# Patient Record
Sex: Female | Born: 1985 | Race: Black or African American | Hispanic: No | Marital: Married | Smoking: Never smoker
Health system: Southern US, Community
[De-identification: ages and names within clinical notes are randomized; demographics above are authoritative.]

## PROBLEM LIST (undated history)

## (undated) DIAGNOSIS — F319 Bipolar disorder, unspecified: Secondary | ICD-10-CM

## (undated) DIAGNOSIS — J45909 Unspecified asthma, uncomplicated: Secondary | ICD-10-CM

## (undated) HISTORY — DX: Bipolar disorder, unspecified: F31.9

## (undated) HISTORY — DX: Unspecified asthma, uncomplicated: J45.909

---

## 2021-07-22 ENCOUNTER — Emergency Department (HOSPITAL_COMMUNITY): Payer: Managed Care, Other (non HMO)

## 2021-07-22 ENCOUNTER — Other Ambulatory Visit: Payer: Self-pay

## 2021-07-22 ENCOUNTER — Encounter (HOSPITAL_COMMUNITY): Payer: Self-pay | Admitting: *Deleted

## 2021-07-22 ENCOUNTER — Emergency Department (HOSPITAL_COMMUNITY)
Admission: EM | Admit: 2021-07-22 | Discharge: 2021-07-22 | Disposition: A | Discharge status: Home / Self Care | Payer: Managed Care, Other (non HMO) | Attending: Emergency Medicine | Admitting: Emergency Medicine

## 2021-07-22 DIAGNOSIS — S90512A Abrasion, left ankle, initial encounter: Secondary | ICD-10-CM | POA: Insufficient documentation

## 2021-07-22 DIAGNOSIS — S0990XA Unspecified injury of head, initial encounter: Secondary | ICD-10-CM | POA: Insufficient documentation

## 2021-07-22 DIAGNOSIS — S80812A Abrasion, left lower leg, initial encounter: Secondary | ICD-10-CM | POA: Insufficient documentation

## 2021-07-22 DIAGNOSIS — S60811A Abrasion of right wrist, initial encounter: Secondary | ICD-10-CM | POA: Insufficient documentation

## 2021-07-22 DIAGNOSIS — R109 Unspecified abdominal pain: Secondary | ICD-10-CM | POA: Insufficient documentation

## 2021-07-22 DIAGNOSIS — M542 Cervicalgia: Secondary | ICD-10-CM | POA: Insufficient documentation

## 2021-07-22 DIAGNOSIS — R0789 Other chest pain: Secondary | ICD-10-CM | POA: Insufficient documentation

## 2021-07-22 DIAGNOSIS — S3992XA Unspecified injury of lower back, initial encounter: Secondary | ICD-10-CM | POA: Diagnosis present

## 2021-07-22 DIAGNOSIS — S300XXA Contusion of lower back and pelvis, initial encounter: Secondary | ICD-10-CM | POA: Diagnosis not present

## 2021-07-22 DIAGNOSIS — R000 Tachycardia, unspecified: Secondary | ICD-10-CM | POA: Diagnosis not present

## 2021-07-22 DIAGNOSIS — M549 Dorsalgia, unspecified: Secondary | ICD-10-CM

## 2021-07-22 LAB — CBC WITH DIFFERENTIAL/PLATELET
Abs Immature Granulocytes: 0.02 10*3/uL (ref 0.00–0.07)
Basophils Absolute: 0 10*3/uL (ref 0.0–0.1)
Basophils Relative: 0 %
Eosinophils Absolute: 0 10*3/uL (ref 0.0–0.5)
Eosinophils Relative: 0 %
HCT: 38.9 % (ref 36.0–46.0)
Hemoglobin: 12.1 g/dL (ref 12.0–15.0)
Immature Granulocytes: 0 %
Lymphocytes Relative: 27 %
Lymphs Abs: 2.1 10*3/uL (ref 0.7–4.0)
MCH: 28.7 pg (ref 26.0–34.0)
MCHC: 31.1 g/dL (ref 30.0–36.0)
MCV: 92.4 fL (ref 80.0–100.0)
Monocytes Absolute: 0.5 10*3/uL (ref 0.1–1.0)
Monocytes Relative: 6 %
Neutro Abs: 5.4 10*3/uL (ref 1.7–7.7)
Neutrophils Relative %: 67 %
Platelets: 304 10*3/uL (ref 150–400)
RBC: 4.21 MIL/uL (ref 3.87–5.11)
RDW: 14.2 % (ref 11.5–15.5)
WBC: 8 10*3/uL (ref 4.0–10.5)
nRBC: 0 % (ref 0.0–0.2)

## 2021-07-22 LAB — BASIC METABOLIC PANEL
Anion gap: 10 (ref 5–15)
BUN: 7 mg/dL (ref 6–20)
CO2: 24 mmol/L (ref 22–32)
Calcium: 9.4 mg/dL (ref 8.9–10.3)
Chloride: 107 mmol/L (ref 98–111)
Creatinine, Ser: 0.75 mg/dL (ref 0.44–1.00)
GFR, Estimated: >60 mL/min (ref >60)
Glucose, Bld: 92 mg/dL (ref 70–99)
Potassium: 3.7 mmol/L (ref 3.5–5.1)
Sodium: 141 mmol/L (ref 135–145)

## 2021-07-22 LAB — I-STAT BETA HCG BLOOD, ED (MC, WL, AP ONLY): I-stat hCG, quantitative: <5 m[IU]/mL (ref <5)

## 2021-07-22 MED ORDER — IOHEXOL 350 MG/ML SOLN
80.0000 mL | Freq: Once | INTRAVENOUS | Status: AC | PRN
  Administered 2021-07-22: 80 mL via INTRAVENOUS

## 2021-07-22 MED ORDER — OXYCODONE-ACETAMINOPHEN 5-325 MG PO TABS
1.0000 | ORAL_TABLET | Freq: Once | ORAL | Status: AC
  Administered 2021-07-22: 1 via ORAL
  Filled 2021-07-22: qty 1

## 2021-07-22 MED ORDER — MELOXICAM 7.5 MG PO TABS: 7.5000 mg | ORAL_TABLET | Freq: Every day | ORAL | 0 refills | Status: DC

## 2021-07-22 MED ORDER — ONDANSETRON 8 MG PO TBDP
8.0000 mg | ORAL_TABLET | Freq: Once | ORAL | Status: AC
  Administered 2021-07-22: 8 mg via ORAL
  Filled 2021-07-22: qty 1
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
## 2021-07-22 NOTE — ED Notes (Signed)
Patient transported to CT 
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
## 2021-11-03 ENCOUNTER — Ambulatory Visit (HOSPITAL_COMMUNITY)
Admission: EM | Admit: 2021-11-03 | Discharge: 2021-11-03 | Disposition: A | Discharge status: Home / Self Care | Payer: 59 | Attending: Psychiatry | Admitting: Psychiatry

## 2021-11-03 DIAGNOSIS — F3132 Bipolar disorder, current episode depressed, moderate: Secondary | ICD-10-CM | POA: Diagnosis not present

## 2021-11-03 MED ORDER — RISPERIDONE 1 MG PO TABS: 1.0000 mg | ORAL_TABLET | Freq: Two times a day (BID) | ORAL | 0 refills | Status: DC
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
## 2021-11-03 NOTE — Discharge Instructions (Addendum)
FOLLOW UP WITH OUT PATIENT PROVIDER  Patient is instructed prior to discharge to: Take all medications as prescribed by his/her mental healthcare provider. Report any adverse effects and or reactions from the medicines to his/her outpatient provider promptly. Patient has been instructed & cautioned: To not engage in alcohol and or illegal drug use while on prescription medicines. In the event of worsening symptoms, patient is instructed to call the crisis hotline, 911 and or go to the nearest ED for appropriate evaluation and treatment of symptoms. To follow-up with his/her primary care provider for your other medical issues, concerns and or health care needs.   Patient is instructed prior to discharge to: Take all medications as prescribed by his/her mental healthcare provider. Report any adverse effects and or reactions from the medicines to his/her outpatient provider promptly. Patient has been instructed & cautioned: To not engage in alcohol and or illegal drug use while on prescription medicines. In the event of worsening symptoms, patient is instructed to call the crisis hotline, 911 and or go to the nearest ED for appropriate evaluation and treatment of symptoms. To follow-up with his/her primary care provider for your other medical issues, concerns and or health care needs.

## 2021-11-03 NOTE — ED Provider Notes (Signed)
Behavioral Health Urgent Care Medical Screening Exam  Patient Name: Marie Rice MRN: [id number] Date of Evaluation: 11/03/21 Chief Complaint:   Diagnosis:  Final diagnoses:  Bipolar affective disorder, currently depressed, moderate (HCC)    History of Present illness: Marie Rice is a 35 y.o. female patient presented to [facility] as a walk in accompanied by her spouse with complaints of "I am fine, this is a misunderstanding me and my husband need to handle".  Marie Rice, 8 y.o., female patient seen face to face by this provider, consulted with Dr. Lucianne Muss; and chart reviewed on 11/03/21.  Per chart review patient was diagnosed with bipolar affective disorder with manic episode and psychotic features.  Past medications are Depakote, risperidone, and trazodone.  Patient reports that she has not taken medications in a long time, states "I do not need them, I know what I need".  Patient reports she has a history of inpatient psychiatric admissions.  Endorses 1 suicidal attempt in the past. States she is from [place]. She has a heavy accent. He recently moved from [facility] to [place] four months ago.   With patient's permission spouse is present during evaluation. During evaluation Marie Rice is in sitting position in no acute distress.  She makes good eye contact.  Speech is clear, coherent, normal rate and tone.  She is guarded with questions and withdrawn at times.She is alert/oriented x 4.  She is anxious with a flat affect. Denies any concerns with appetite and reports she has broken sleep at times. There is no indication that she is currently responding to internal/external stimuli or experiencing delusional thought content or paranoia. She denies racing thoughts. Denies auditory visual hallucinations. She denies suicidal/self-harm/homicidal ideation.  Patient contracts for safety. Denies access to firearms/weopons.  Spouse agrees, he  does not think patient is a harm to herself or others.  His major concern is her paranoia. Spouse reports patient is not being truthful and forthcoming with all the information.  Reports patient is only sleeping 2 hours per night and has decreased food intake with weight loss.  States she is paranoid and believes her food is being poisoned, patient interjects and states that is not true.  Spouse states if they want to have a private conversation they have to go outside because the patient believes the government and FBI is watching her because her father is a Occupational hygienist.  Spouse confirmed her father is a lower level Occupational hygienist.  Patient denies she is paranoid.  States she is eating healthier and exercising and has lost weight on purpose.  Spouse reports patients family and himself have taken the patient's phone away because she has sent hurtful messages to family members.  Patient interjects and states that is her way of healing.  Patient and spouse agree that patient has not sent any suicidal or homicidal comments.  Spouse states one of the reasons that the phone was taken away was due to the patient talking with someone "she should not talk to".  Patient interjects and states she was talking to a female friend and  "it could have been an affair, I did kiss him". This appeared to be a major stressor to spouse and patient. The collateral that spouse has provided about patients behavior appear to be manic with some psychotic features, but patient denies.   Patient is adamant that she would like to be discharged and return home.  Explained to patient and spouse she does not meet criteria for inpatient psychiatric admission.  Once patient realized that she would not be admitted she became more at ease.  She was willing to discuss medications.  She ask if Risperdal helps with the paranoia and auditory hallucinations but was adamant that she was not having any.  States she would be wiling to be restarted on Risperdal.   Patient agreed to have EKG performed.  Printed prescription for Risperdal 1 mg twice daily was given for 7-day supply.  Educated no refills will be given at this facility.  Outpatient psychiatric resources were provided.   At this time Marie Rice is educated and verbalizes understanding of mental health resources and other crisis services in the community. She is instructed to call 911 and present to the nearest emergency room should she experience any suicidal/homicidal ideation, auditory/visual/hallucinations, or detrimental worsening of her mental health condition.  She was a also advised by Clinical research associate that she could call the toll-free phone on insurance card to assist with identifying in network counselors and agencies.      Psychiatric Specialty Exam  Presentation  General Appearance:Casual  Eye Contact:Good  Speech:Clear and Coherent; Normal Rate  Speech Volume:Normal  Handedness:Right   Mood and Affect  Mood:Anxious  Affect:Congruent   Thought Process  Thought Processes:Coherent  Descriptions of Associations:Intact  Orientation:Full (Time, Place and Person)  Thought Content:Logical  Diagnosis of Schizophrenia or Schizoaffective disorder in past: No  Duration of Psychotic Symptoms: Less than six months  Hallucinations:Other (comment)  Ideas of Reference:None  Suicidal Thoughts:No  Homicidal Thoughts:No   Sensorium  Memory:Immediate Good; Recent Good; Remote Good  Judgment:Fair  Insight:Fair   Executive Functions  Concentration:Good  Attention Span:Good  Recall:Good  Fund of Knowledge:Good  Language:Good   Psychomotor Activity  Psychomotor Activity:Normal   Assets  Assets:Communication Skills; Housing; Intimacy; Leisure Time; Physical Health   Sleep  Sleep:Good  Number of hours: No data recorded  No data recorded  Physical Exam: Physical Exam Vitals and nursing note reviewed.  Constitutional:      General: She is not  in acute distress.    Appearance: Normal appearance. She is not ill-appearing.  HENT:     Head: Normocephalic.  Eyes:     General:        Right eye: No discharge.        Left eye: No discharge.     Conjunctiva/sclera: Conjunctivae normal.  Cardiovascular:     Rate and Rhythm: Normal rate.  Pulmonary:     Effort: Pulmonary effort is normal.  Musculoskeletal:        General: Normal range of motion.     Cervical back: Normal range of motion.  Skin:    Coloration: Skin is not jaundiced or pale.  Neurological:     Mental Status: She is alert and oriented to person, place, and time.  Psychiatric:        Attention and Perception: Attention and perception normal.        Mood and Affect: Mood is anxious.        Speech: Speech normal.        Behavior: Behavior is withdrawn.        Thought Content: Thought content normal.        Cognition and Memory: Cognition normal.        Judgment: Judgment normal.   Review of Systems  Constitutional: Negative.   HENT: Negative.    Eyes: Negative.   Respiratory: Negative.    Cardiovascular: Negative.   Musculoskeletal: Negative.   Skin: Negative.   Neurological: Negative.  Psychiatric/Behavioral:  The patient is nervous/anxious.   Blood pressure (!) 138/96, pulse (!) 120, temperature 98.6 F (37 C), temperature source Oral, resp. rate 20, SpO2 100 %. There is no height or weight on file to calculate BMI.  Musculoskeletal: Strength & Muscle Tone: within normal limits Gait & Station: normal Patient leans: N/A   [facility] MSE Discharge Disposition for Follow up and Recommendations: Based on my evaluation the patient does not appear to have an emergency medical condition and can be discharged with resources and follow up care in outpatient services for Medication Management  Discharge patient..  Patient agreed to have EKG performed. QT/QTC 334/437.  Printed prescription for Risperdal 1 mg twice daily was given for 7-day supply.  Educated no  refills will be given at this facility.   Outpatient psychiatric resources were provided for [facility], [facility], [facility], [facility], and [facility]iatric center. Instructed to make an appt ASAP for follow up.  No evidence of imminent risk to self or others at present.    Patient does not meet criteria for psychiatric inpatient admission. Discussed crisis plan, support from social network, calling 911, coming to the Emergency Department, and calling Suicide Hotline.    Ardis Hughs, NP 11/03/2021, 5:23 PM

## 2021-11-03 NOTE — BH Assessment (Signed)
Marie Rice,  Urgent, MRX #[id number]; 35 years old presents this date with her husband, Delmar Landau, [phone].  Pt denies SI or HI.  Pt reports hearing voices and paranoia for two weeks.  Pt admits to prior MH diagnosis ; also is not taking prescribed  medication for symptom management.  MSE signed by pt's husband.

## 2021-11-05 ENCOUNTER — Encounter: Payer: Self-pay | Admitting: Family Medicine

## 2021-11-05 ENCOUNTER — Other Ambulatory Visit (HOSPITAL_COMMUNITY)
Admission: RE | Admit: 2021-11-05 | Discharge: 2021-11-05 | Disposition: A | Discharge status: Home / Self Care | Payer: Managed Care, Other (non HMO) | Source: Ambulatory Visit | Attending: Family Medicine | Admitting: Family Medicine

## 2021-11-05 ENCOUNTER — Telehealth (HOSPITAL_COMMUNITY): Payer: Self-pay | Admitting: Family Medicine

## 2021-11-05 ENCOUNTER — Ambulatory Visit (INDEPENDENT_AMBULATORY_CARE_PROVIDER_SITE_OTHER): Payer: Managed Care, Other (non HMO) | Admitting: Family Medicine

## 2021-11-05 ENCOUNTER — Other Ambulatory Visit: Payer: Self-pay

## 2021-11-05 VITALS — BP 129/81 | HR 105 | Temp 98.4°F | Resp 16 | Ht 64.0 in | Wt 148.8 lb

## 2021-11-05 DIAGNOSIS — Z7689 Persons encountering health services in other specified circumstances: Secondary | ICD-10-CM

## 2021-11-05 DIAGNOSIS — F319 Bipolar disorder, unspecified: Secondary | ICD-10-CM | POA: Diagnosis not present

## 2021-11-05 DIAGNOSIS — Z202 Contact with and (suspected) exposure to infections with a predominantly sexual mode of transmission: Secondary | ICD-10-CM

## 2021-11-05 MED ORDER — RISPERIDONE 1 MG PO TABS: 1.0000 mg | ORAL_TABLET | Freq: Two times a day (BID) | ORAL | 0 refills | Status: DC

## 2021-11-05 NOTE — Progress Notes (Signed)
Patient is here for HFU, bipolar. Patient father is here for support.

## 2021-11-05 NOTE — BH Assessment (Signed)
Care Management - Follow Up Discharges   Writer attempted to make contact with patient today and was unsuccessful.  Writer left a HIPPA compliant voice message.   Per chart review, patient was provided with outpatient resources.   

## 2021-11-05 NOTE — Progress Notes (Signed)
New Patient Office Visit  Subjective:  Patient ID: Marie Rice, female    DOB: 1986-06-26  Age: 35 y.o. MRN: [id number]  CC:  Chief Complaint  Patient presents with   Establish Care    HPI Marie Rice presents for to establish care. Patient reports that she has recently been released form hospital 2/2 initial dx of bipolar disorder. Patient reports that she has had a past history of mental health issues with previous hospitalizations. She has been off and on meds but has now been on respiridal for 3 days and notes improvements. Some of her symptoms were worsened after having a baby 3 years ago. Patient also wants to be checked for STD that she believes she may have been exposed to.   Past Medical History:  Diagnosis Date   Asthma    Bipolar 1 disorder [facility])     Past Surgical History:  Procedure Laterality Date   CESAREAN SECTION      History reviewed. No pertinent family history.  Social History   Socioeconomic History   Marital status: Married    Spouse name: Not on file   Number of children: Not on file   Years of education: Not on file   Highest education level: Not on file  Occupational History   Not on file  Tobacco Use   Smoking status: Never   Smokeless tobacco: Never  Substance and Sexual Activity   Alcohol use: Not Currently   Drug use: Not Currently   Sexual activity: Not on file  Other Topics Concern   Not on file  Social History Narrative   Not on file   Social Determinants of Health   Financial Resource Strain: Not on file  Food Insecurity: Not on file  Transportation Needs: Not on file  Physical Activity: Not on file  Stress: Not on file  Social Connections: Not on file  Intimate Partner Violence: Not on file    ROS Review of Systems  Psychiatric/Behavioral:  Negative for self-injury, sleep disturbance and suicidal ideas. The patient is not nervous/anxious.   All other systems reviewed and are  negative.  Objective:   Today's Vitals: BP 129/81   Pulse (!) 105   Temp 98.4 F (36.9 C) (Oral)   Resp 16   Ht 5\' 4"  (1.626 m)   Wt 148 lb 12.8 oz (67.5 kg)   BMI 25.54 kg/m   Physical Exam Vitals and nursing note reviewed.  Constitutional:      General: She is not in acute distress. Cardiovascular:     Rate and Rhythm: Normal rate and regular rhythm.  Pulmonary:     Effort: Pulmonary effort is normal.     Breath sounds: Normal breath sounds.  Neurological:     General: No focal deficit present.     Mental Status: She is alert and oriented to person, place, and time.  Psychiatric:        Mood and Affect: Mood is anxious.        Speech: Speech normal.        Behavior: Behavior is agitated. Behavior is cooperative.    Assessment & Plan:   1. Bipolar I disorder (HCC) Continue present and referral to [facility] for counseling and to Psych for med management.  - Ambulatory referral to Psychiatry  2. Possible exposure to STD Cultures obtained and are pending. - Cervicovaginal ancillary only  3. Encounter to establish care    Outpatient Encounter Medications as of 11/05/2021  Medication Sig   risperiDONE (  RISPERDAL) 1 MG tablet Take 1 tablet (1 mg total) by mouth 2 (two) times daily.   No facility-administered encounter medications on file as of 11/05/2021.    Follow-up: No follow-ups on file.   Tommie Raymond, MD

## 2021-11-06 LAB — CERVICOVAGINAL ANCILLARY ONLY
Bacterial Vaginitis (gardnerella): NEGATIVE
Candida Glabrata: NEGATIVE
Candida Vaginitis: NEGATIVE
Chlamydia: NEGATIVE
Comment: NEGATIVE
Comment: NEGATIVE
Comment: NEGATIVE
Comment: NEGATIVE
Comment: NEGATIVE
Comment: NORMAL
Neisseria Gonorrhea: NEGATIVE
Trichomonas: NEGATIVE

## 2021-11-18 ENCOUNTER — Telehealth: Payer: Self-pay | Admitting: Clinical

## 2021-11-18 NOTE — Telephone Encounter (Signed)
I spoke with this pt per PCP request (see below). Pt seemed confused and stated that she had a psychiatry appt that her PCP set up. I explained my role to pt and scheduled an appt with her for 11/26/21 at 3:30pm.   "Could you please schedule an appt with this patient. Please let me know if you think she will need additional evaluation at a later point"

## 2021-11-25 ENCOUNTER — Telehealth: Payer: Self-pay | Admitting: Family Medicine

## 2021-11-25 NOTE — Telephone Encounter (Signed)
Pt requesting a call from a nurse regarding a vaginal swab result. Please advise and thank you

## 2021-11-25 NOTE — Telephone Encounter (Signed)
Patient is aware of lab results.

## 2021-11-26 ENCOUNTER — Institutional Professional Consult (permissible substitution): Payer: Self-pay | Admitting: Clinical

## 2022-01-20 ENCOUNTER — Encounter: Payer: Self-pay | Admitting: Family Medicine

## 2022-03-24 ENCOUNTER — Encounter: Payer: Self-pay | Admitting: Family Medicine
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
## 2022-08-29 ENCOUNTER — Encounter: Payer: Self-pay | Admitting: Family Medicine

## 2022-09-11 ENCOUNTER — Ambulatory Visit (HOSPITAL_COMMUNITY)
Admission: EM | Admit: 2022-09-11 | Discharge: 2022-09-11 | Disposition: A | Discharge status: Home / Self Care | Payer: BC Managed Care – PPO | Attending: Psychiatry | Admitting: Psychiatry

## 2022-09-11 DIAGNOSIS — F22 Delusional disorders: Secondary | ICD-10-CM

## 2022-09-11 DIAGNOSIS — G4700 Insomnia, unspecified: Secondary | ICD-10-CM | POA: Diagnosis not present

## 2022-09-11 MED ORDER — RISPERIDONE 1 MG PO TABS
1.0000 mg | ORAL_TABLET | Freq: Two times a day (BID) | ORAL | Status: DC
  Filled 2022-09-11 (×2): qty 14

## 2022-09-11 NOTE — Discharge Instructions (Addendum)
Please follow up with outpatient medication management with your established provider, Dr. Altamese [place] on Monday, September 15, 2022.  Patient is instructed prior to discharge to: Take all medications as prescribed by his/her mental healthcare provider. Report any adverse effects and or reactions from the medicines to his/her outpatient provider promptly. Keep all scheduled appointments, to ensure that you are getting refills on time and to avoid any interruption in your medication.  If you are unable to keep an appointment call to reschedule.  Be sure to follow-up with resources and follow-up appointments provided.  Patient has been instructed & cautioned: To not engage in alcohol and or illegal drug use while on prescription medicines. In the event of worsening symptoms, patient is instructed to call the crisis hotline, 911 and or go to the nearest ED for appropriate evaluation and treatment of symptoms. To follow-up with his/her primary care provider for your other medical issues, concerns and or health care needs.

## 2022-09-11 NOTE — ED Provider Notes (Signed)
Behavioral Health Urgent Care Medical Screening Exam  Patient Name: Marie Rice MRN: [id number] Date of Evaluation: 09/11/22 Chief Complaint:   Diagnosis:  Final diagnoses:  Insomnia, unspecified type  Paranoia (HCC)   History of Present illness: Marie Rice is a 36 y.o. female. Pt presents voluntarily to [facility] for walk-in assessment.  Pt is accompanied by her husband, Marie Rice, who remains with pt throughout the assessment as per pt verbal request and w/ pt verbal consent. Pt is assessed face-to-face by nurse practitioner.   Audyn Hutchinson-Walker, 36 y.o., female patient seen face to face by this provider, and chart reviewed on 09/11/22. Per prior documentation, pt has been diagnosed with bipolar 1 disorder, bipolar affective disorder with manic episode and psychotic features. Pt was previously seen at this facility on 11/03/21 presenting with poor sleep and paranoia. At that time she was discharged with 7 day sample for risperdal 1mg  twice daily. Today, pt history is provided by both pt and Christopher. Pt appears to present with possible thought blocking vs being guarded. She is slow to respond and responds in short responses. Eye contact minimal at times, staring at times.  Pt reports she is presenting today due to experiencing poor sleep for the past 3 to 4 nights and due to paranoia regarding people/something being in the attic. Pt states she has been sleeping 5 hours/night. However, states this is not correct. Marie Rice states pt is sleeping 2 to 3 hours maximum/night. He states he is aware of signs of pt decompensation and he believes pt is decompensating. He states pt is talking about "something in the attic". When asked about this, pt minimizes and states she is "hearing bumping and knocking sound" in the attic. When asked if she believes there is something up there, she is slow to respond, and states "not sure if  there's something". She denies SI/VI/HI, AVH. Marie Rice states he does not feel pt is a danger to herself or anyone else at this time but that he feels she needs to be treated in case it worsens. Pt has history of 2 inpatient psychiatric hospitalizations. Last hospitalization was about 4 years ago for "similar but worse" presentation. Pt denies NSSI or SA. She denies SU. She is living with her husband and daughter. She does not have access to a firearm or other weapon. She is being prescribed risperidone 0.5mg  QHS by Dr. Cristal Rice, although has not been taking it as prescribed, has started taking it 3 or 4 nights ago when she started experiencing symptoms and felt that she needed to address them. Per pt and Maggie Schwalbe, when pt was discharged from this facility in 2022 with prescription for risperdal 1mg  twice daily, this was effective for addressing symptoms. They state they attempted to reach pt's psychiatrist, Dr. 2023, today and were told they could not be seen today. They were told to come to this facility. Her next appointment with Dr. is in December 2023. I discussed providing 7 day sample of risperidone 1mg  twice daily and following up with Dr. Maggie Schwalbe. While I was consulting with pharmacy for samples, pt and January 2024 informed me that pt has an appointment with Dr. on Monday, October 2. Pt will be discharged with 7 day sample of risperidone 1mg  twice daily and will follow up with Dr. Cristal Rice on October 2.  Psychiatric Specialty Exam  Presentation  General Appearance:Appropriate for Environment; Casual; Fairly Groomed  Eye Contact:Other (comment) (minimal at times, staring at times)  Speech:Clear and Coherent; Other (comment) (  slow to respond)  Speech Volume:Normal  Handedness:Right  Mood and Affect  Mood:Euthymic  Affect:Flat  Thought Process  Thought Processes:Coherent  Descriptions of Associations:Intact  Orientation:Full (Time, Place and Person)  Thought Content:Delusions;  Paranoid Ideation  Diagnosis of Schizophrenia or Schizoaffective disorder in past: No  Duration of Psychotic Symptoms: Greater than six months  Hallucinations:None  Ideas of Reference:Paranoia  Suicidal Thoughts:No  Homicidal Thoughts:No  Sensorium  Memory:Immediate Good  Judgment:Intact  Insight:Present  Executive Functions  Concentration:Other (comment) (slow to respond)  Attention Span:Other (comment) (slow to respond)  Recall:Other (comment) (slow to respond)  Fund of Knowledge:Fair  Language:Fair  Psychomotor Activity  Psychomotor Activity:Normal  Assets  Assets:Communication Skills; Desire for Improvement; Financial Resources/Insurance; Housing; Social Support  Sleep  Sleep:Poor  Number of hours: No data recorded  No data recorded  Physical Exam: Physical Exam Cardiovascular:     Rate and Rhythm: Normal rate.  Pulmonary:     Effort: Pulmonary effort is normal.  Neurological:     Mental Status: She is alert and oriented to person, place, and time.  Psychiatric:        Mood and Affect: Mood normal. Affect is flat.        Speech: Speech is delayed.        Behavior: Behavior is cooperative.        Thought Content: Thought content is paranoid and delusional.    Review of Systems  Constitutional:  Negative for chills and fever.  Respiratory:  Negative for shortness of breath.   Cardiovascular:  Negative for chest pain and palpitations.  Gastrointestinal:  Negative for abdominal pain.  Neurological:  Negative for headaches.   Blood pressure (!) 146/90, pulse 97, temperature 98.1 F (36.7 C), temperature source Oral, resp. rate 18, SpO2 100 %. There is no height or weight on file to calculate BMI.  Musculoskeletal: Strength & Muscle Tone: within normal limits Gait & Station: normal Patient leans: N/A  [facility] MSE Discharge Disposition for Follow up and Recommendations: Based on my evaluation the patient does not appear to have an emergency medical  condition and can be discharged with resources and follow up care in outpatient services for Medication Management and Individual Therapy  Tharon Aquas, NP 09/11/2022, 5:37 PM

## 2022-09-11 NOTE — Progress Notes (Signed)
Patient presents to the [facility] stating that she has been experienicng anxiety and sleep disturbance.  Patient states that she sees Dr. Altamese [place] and is prescribed Buspar, but she states that it is not working.  Patient states that she has only slept a couple hours each night over the past few days and she is starting to have thought disturbance.  She is hearing noises in the celiling.  Patient denies SI/HI. Patient has no substance use issues.  Patient's husband is with her and states that they were her last year for the same issue.  Patient is routine.

## 2023-03-12 ENCOUNTER — Ambulatory Visit (HOSPITAL_COMMUNITY)
Admission: EM | Admit: 2023-03-12 | Discharge: 2023-03-12 | Disposition: A | Discharge status: Home / Self Care | Payer: BC Managed Care – PPO | Attending: Family | Admitting: Family

## 2023-03-12 DIAGNOSIS — F319 Bipolar disorder, unspecified: Secondary | ICD-10-CM | POA: Diagnosis not present

## 2023-03-12 MED ORDER — RISPERIDONE 1 MG PO TABS: 1.0000 mg | ORAL_TABLET | Freq: Two times a day (BID) | ORAL | 0 refills | Status: DC

## 2023-03-12 NOTE — Progress Notes (Signed)
   03/12/23 1256  [place] (Walk-ins at [facility], [facility], [facility] only)  How Did You Hear About [place]? Family/Friend (father brought her in)  What Is the Reason for Your Visit/Call Today? Pt is a 37 yo female who presented voluntarily and accompanied by her father, Daiva Huge, due to worsening symptoms of Bipolar d/o. Pt gave verbal permission for father to participate in her assessment. Pt stated that she was prescribed and is taking at times her medications. Pt stated that she was seeing a psychiatrist and taking medications but the medication "made me sick." Per father, pt was on a lower does of the same medications she had been taking and "was doing fine" but then her provider rasied the dosage. Pt stated she was having unwanted side effects and stopped taking them more than 6 months ago. Per pt she has not been taking her medications or taking them very inconsistently for about a year. Hx of Bipolar d/o. Pt denied SI, HI, NSSH, AVH, pranoia and any substance use. Pt did report a suicide attempt about 7 years ago which she stated was more of a gesture than a true attempt. She started to jump from a high cliff but stopped herself. Pt stated that she has seen an OP therapist but has visits by phone and only calls her as needed.  How Long Has This Been Causing You Problems? > than 6 months  Have You Recently Had Any Thoughts About Hurting Yourself? No  Are You Planning to Commit Suicide/Harm Yourself At This time? No  Have you Recently Had Thoughts About [place]? No  Are You Planning To Harm Someone At This Time? No  Are you currently experiencing any auditory, visual or other hallucinations? No  Have You Used Any Alcohol or Drugs in the Past 24 Hours? No  Do you have any current medical co-morbidities that require immediate attention? No  Clinician description of patient physical appearance/behavior: casually dressed, seemed neat and clean, calm but irritable, cooperative but a bit demonding,  alert and seemed fully oriented.  What Do You Feel Would Help You the Most Today? Medication(s)  If access to [facility] was not available, would you have sought care in the Emergency Department? No  Determination of Need Routine (7 days)  Options For Referral Medication Management;Outpatient Therapy

## 2023-03-12 NOTE — ED Provider Notes (Signed)
Behavioral Health Urgent Care Medical Screening Exam  Patient Name: Marie Rice MRN: [id number] Date of Evaluation: 03/12/23 Chief Complaint:   Diagnosis:  Final diagnoses:  Bipolar I disorder ([place])    History of Present illness: Marie Rice is a 37 y.o. female. Patient presents voluntarily to [facility] behavioral health for walk-in assessment.  Patient is accompanied by her father, Christy Sartorius.  Patient prefers that Christy Sartorius remain present during assessment.  Patient is assessed, face-to-face, by nurse practitioner. She is seated in assessment area, no acute distress. Consulted with provider, Dr.  Dwyane Dee, and chart reviewed on 03/12/2023. She  is alert and oriented, pleasant and cooperative during assessment.   Marie Rice would like to have her risperidone medication refilled.  She is also seeking additional outpatient psychiatry resources.  Patient reports "I stop see my psychiatrist because I asked for a work excuse and he could not provide that."  She feels that she would be more satisfied with a different outpatient provider, most recently seen by Dr. Altamese Carthage at [facility].  She has been intermittently compliant with medications including risperidone 1 mg twice daily.  She has run out of her medication completely within the last month.  Patient is linked with virtual individual counseling provider.  Typically meets with this provider by phone. Patient's diagnoses include bipolar 1 disorder, bipolar affective disorder manic episode.  She denies history of inpatient psychiatric hospitalization.  No family mental health history reported.  Patient  presents with euthymic mood, congruent affect. She  denies suicidal and homicidal ideations. Denies history of nonsuicidal self-harm behavior.  Patient easily  contracts verbally for safety with this Probation officer.  Lynann endorses 1 previous episode of suicidal ideation.  Approximately 7 years ago she had suicidal ideation with thoughts  to jump from a high cliff but did follow through with plan.    Patient has normal speech and behavior.  She  denies auditory and visual hallucinations.  Patient is able to converse coherently with goal-directed thoughts and no distractibility or preoccupation.  Denies symptoms of paranoia.  Objectively there is no evidence of psychosis/mania or delusional thinking.  Marie Rice resides in [place] with her husband.  She denies access to weapons.  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  Patient endorses average sleep and appetite.  She denies alcohol and substance use.  Patient offered support and encouragement.  She gives verbal consent to speak with her father Christy Sartorius.  Patient's father reports he believes patient is not sleeping the 7 to 8 hours she reported each night.  He reports that patient is often working late.  Per patient's father, "every time she stops taking her medication she gets worse." Patient's father would prefer patient consider meeting with counselor in a face-to-face format.  Patient agrees with plan. Patient's father denies safety concerns and confirms no knowledge of access to any weapons.  He agrees with plan for follow-up with outpatient psychiatry resources.   Patient and family are educated and verbalize understanding of mental health resources and other crisis services in the community. They are instructed to call 911 and present to the nearest emergency room should patient experience any suicidal/homicidal ideation, auditory/visual/hallucinations, or detrimental worsening of mental health condition.      [facility] from 03/12/2023 in [facility] ED from 11/03/2021 in [facility] ED from 07/22/2021 in [facility] Emergency Department at [place] No Risk No Risk No Risk  Psychiatric Specialty Exam  Presentation  General Appearance:Appropriate for  Environment; Casual  Eye Contact:Good  Speech:Clear and Coherent; Normal Rate  Speech Volume:Normal  Handedness:Right   Mood and Affect  Mood: Euthymic  Affect: Congruent   Thought Process  Thought Processes: Coherent; Goal Directed  Descriptions of Associations:Intact  Orientation:Full (Time, Place and Person)  Thought Content:Logical; WDL  Diagnosis of Schizophrenia or Schizoaffective disorder in past: No  Duration of Psychotic Symptoms: Greater than six months  Hallucinations:None  Ideas of Reference:None  Suicidal Thoughts:No  Homicidal Thoughts:No   Sensorium  Memory: Immediate Good; Recent Fair  Judgment: Intact  Insight: Present   Executive Functions  Concentration: Fair  Attention Span: Fair  Recall: [place] of Knowledge: Good  Language: Good   Psychomotor Activity  Psychomotor Activity: Normal   Assets  Assets: Communication Skills; Desire for Improvement; Financial Resources/Insurance; Housing; Physical Health; Resilience; Social Support   Sleep  Sleep: Poor  Number of hours: No data recorded  Physical Exam: Physical Exam Vitals and nursing note reviewed.  Constitutional:      Appearance: Normal appearance. She is well-developed and normal weight.  HENT:     Head: Normocephalic and atraumatic.     Nose: Nose normal.  Cardiovascular:     Rate and Rhythm: Normal rate.  Pulmonary:     Effort: Pulmonary effort is normal.  Musculoskeletal:        General: Normal range of motion.     Cervical back: Normal range of motion.  Skin:    General: Skin is warm and dry.  Neurological:     Mental Status: She is alert and oriented to person, place, and time.  Psychiatric:        Attention and Perception: Attention and perception normal.        Mood and Affect: Mood and affect normal.        Speech: Speech normal.        Behavior: Behavior normal. Behavior is cooperative.        Thought Content: Thought content  normal. Thought content is not paranoid.        Cognition and Memory: Cognition and memory normal.    Review of Systems  Constitutional: Negative.   HENT: Negative.    Eyes: Negative.   Respiratory: Negative.    Cardiovascular: Negative.   Gastrointestinal: Negative.   Genitourinary: Negative.   Musculoskeletal: Negative.   Skin: Negative.   Neurological: Negative.   Psychiatric/Behavioral: Negative.     Blood pressure 102/75, pulse 93, temperature (!) 97.5 F (36.4 C), temperature source Oral, resp. rate 18, SpO2 100 %. There is no height or weight on file to calculate BMI.  Musculoskeletal: Strength & Muscle Tone: within normal limits Gait & Station: normal Patient leans: N/A   [place] M[facility] Discharge Disposition for Follow up and Recommendations: Based on my evaluation the patient does not appear to have an emergency medical condition and can be discharged with resources and follow up care in outpatient services for Medication Management and Individual Therapy Follow-up with outpatient psychiatry, resources provided. Medications: -Risperidone 1 mg twice daily/mood   Lucky Rathke, FNP 03/12/2023, 1:47 PM

## 2023-03-12 NOTE — Discharge Instructions (Addendum)
Based on the information that you have provided and the presenting issues outpatient services and resources for have been recommended.  It is imperative that you follow through with treatment recommendations within 5-7 days from the of discharge to mitigate further risk to your safety and mental well-being. A list of referrals has been provided below to get you started.  You are not limited to the list provided.  In case of an urgent crisis, you may contact the Mobile Crisis Unit with [facility] at 1.8034804044.   Therapists that accept [facility] and [facility]  [address] [place], [facility], [facility]    Our Practice  Psychotherapy, which is also often referred to as talk therapy, is a mental health treatment that addresses the difficulties one encounters as the result of mental illness, emotional trauma, adjustment difficulties, and a wide variety of mental health challenges. Psychotherapy is a treatment that is intended to assist a person in controlling and eliminating negative symptoms that have a problematic impact on one's daily functioning and thought processes. The desired outcome is improved functioning, increased well-being, and emotional healing.    Our Treatment Focus Our focus is to help individuals heal, energize, and become aware of their inner strengths. We achieve this by providing a neutral safe space, listening to your concerns, and customizing a treatment plan.   Our Patient Promise We promise to be there for you every step of your journey. Our goal is to help you grow from your struggles, heal from your pain, and move forward to where you want to be in your life.             Based on the information you have provided and the presenting issue, outpatient services and resources for have been recommended.  It is imperative that you follow through with  treatment recommendations within 5-7 days from the of discharge to mitigate further risk to your safety and mental well-being. A list of referrals has been provided below to get you started.  You are not limited to the list provided.  In case of an urgent crisis, you may contact the Mobile Crisis Unit with [facility] at [phone].   Purvis Kilts, MSW, [place], [place] [address] [place], [place] [address] Office [phone] Fax [phone]   I am pleased that you have selected me as your therapist.  I have a Restaurant manager, fast food of Social Work degree, obtained at the [employer] in 2000.  I am licensed with the [place] Work Geographical information systems officer ([id number]).   The services offered to you include individual, family, and group counseling.  My therapeutic approach  is derived from my training and experience in Cognitive-Behavioral Therapy, Solution Focused Therapy, Reality Therapy, as well as, interpersonal and developmental theories of counseling.  I have special interests in anxiety, depression, sexual and physical abuse, play therapy, child and adolescent development, and family systems.  I am specifically trained in Trauma Focused-Cognitive Behavioral Therapy (TF-CBT).  I will not discriminate because of age, race, gender, sexual orientation or religion.  If there is anything I should know concerning your culture or religion, I ask that you please inform me so that I can better understand you.  If for any reason, I determine that my knowledge and expertise are not sufficient for your particular needs, I will make every effort to refer you to another counselor who  is prepared to work more effectively with you.  Are you or a loved one dealing with the aftermath of a traumatic experience? Do life transitions leave you grappling with feelings of depression or anxiety? Are you concerned about your child's negative behaviors, sleep  disturbances, or difficulties during a divorce? It's important to understand that children primarily operate based on emotions, while adults tend to use rational thinking. This emotional focus can impact a child's ability to make effective decisions and express their feelings.   Services Individual Counseling  Individual counseling is a personal opportunity to receive support and experience growth during challenging times in life. Individual counseling can help one deal with many personal topics in life such as anger, depression, anxiety, substance abuse, marriage and relationship challenges, parenting problems, school difficulties, career changes, etc.  Family Counseling  Families often are faced with issues and challenges that greatly impact all the members. The goal of family therapy is to renew and maintain the natural family structure and support group with an emphasis on returning the individual to a healthy family and community life. Family counseling can help improve communication, resolve conflict, and improve connections. [/one-half-first]    Parenting Coordinator Parent Coordinator is a neutral third party acting in the children's best interest attempting to reach a fair compromise of the issues at hand. Assisting parents manage their parenting plan, improve communication, and resolve disputes. A court appointment neutral party.     Treatment specialization includes: Depression  Anxiety  Attention Deficit/Hyperactive Disorder  Grief Counseling  Conflict Resolution  Emotional Regulation  Family Counseling  Divorce/Separation  Mediation  Conflict Resolution  Relationship Building  Parenting Coordinator  Parental Support    Base on the information you have provided and the presenting issue, outpatient services and resources for have been recommended.  It is imperative that you follow through with treatment recommendations within 5-7 days from the of discharge to  mitigate further risk to your safety and mental well-being. A list of referrals has been provided below to get you started.  You are not limited to the list provided.  In case of an urgent crisis, you may contact the Mobile Crisis Unit with [facility] at 1.404-143-0101.    [facility], [place] [address], [place] [address]  [phone]  Are you feeling "some kind of way", unsettled in yourself, or having a difficult time identifying how to navigate through your world. You are not alone. We all encounter times when the life we are living becomes overwhelming and we wish for someone to hear the things we are feeling inside but are unable to express. We are looking to understand why these things are happening to [place], and how to feel at peace with ourselves. We want to know that these negative feelings will not always be with [place]. No, I do not have a magic wand that will solve all your worries.What I offer is the opportunity to listen to your story and help you develop  As a graduate of [employer], with a Masters of arts in counseling psychology, I serve a diverse population while personalizing care. The skills used come from art therapy, play therapy, CBT(Cognitive Behavioral Therapy), EMDR, and other specialized therapeutic interventions. I cater to my client's needs while using best practices. I started my counseling practice with the desire of "helping others heal" from the conflict they have faced in life and their personal struggles. I have had the honor of assisting  people overcome many of their life struggles and to move forward and enjoy the life that they wish to have. Give me a call and let me know how I can help you.                                                                                               Medication Management providers that accept [employer] and [employer] [facility], [place], [place], [address] [phone] phone [phone] phone ([address], AmeriHealth, Anthem/Elevance, BCBS, [place], [place], [employer] of America, Healthy [place], [place], [place], [place], [place], [employer], [place], Out of Network)  [employer], [address] [place], [place], [address] [phone] phone ([place], Anthem/Elevance, [employer] Options/Carelon, [place], [place], [place], [place], [place], Medicare, [place], [place], [place], [facility])   [facility] [address], [place], [place], [address] [phone] phone [phone] fax ([address], [place] , [place], [place], [place], UHC, [place], Self-Pay)  Evangeline Dakin [place]. [place], [place], [address] [phone] phone ([address] Anthem/Elevance, [place], [place], [place], [place], [place], [employer], [place], [place], [place], [facility])  [place] - 6-8 MONTH WAIT FOR THERAPY; SOONER FOR MEDICATION MANAGEMENT [address] [place], [place], [address] [phone] phone ([address] [place], [place], [place], [place], Friday Health Plans, [place], [place], [place], [place], [place], [place], Tricare, [facility], The [employer] Companies, Company secretary)  [place] [place], [place] [address]  [phone]  [place] [address] [place],  [address]  [phone]   [facility] [address], [place], [place], [address] [phone] phone  [facility] [address], [place], [place], [address] [phone] phone  [facility] [address], [place], [place],  [address] [phone] phone   Patient is instructed prior to discharge to:  Take all medications as prescribed by his/her mental healthcare provider. Report any adverse effects and or reactions from the medicines to his/her outpatient provider promptly. Keep all scheduled appointments, to ensure that you are getting refills on time and to avoid any interruption in your medication.  If you are unable to keep an appointment call to reschedule.  Be sure to follow-up with resources and follow-up appointments provided.  Patient has been instructed & cautioned: To not engage in alcohol and or illegal drug use while on prescription medicines. In the event of worsening symptoms, patient is instructed to call the crisis hotline, 911 and or go to the nearest ED for appropriate evaluation and treatment of symptoms. To follow-up with his/her primary care provider for your other medical issues, concerns and or health care needs.  Information: -National Suicide Prevention Lifeline 1-800-SUICIDE or 228-386-2198.  -988 offers 24/7 access to trained crisis counselors who can help people experiencing mental health-related distress. People can call or text 988 or chat 988lifeline.org for themselves or if  they are worried about a loved one who may need crisis support.

## 2023-04-09 ENCOUNTER — Telehealth (HOSPITAL_COMMUNITY): Payer: Self-pay | Admitting: *Deleted

## 2023-04-09 NOTE — Telephone Encounter (Signed)
Risperidone request for patient, she has never been seen here on second floor only downstairs thru emergency services. Refer this request to Doran Heater NP as she last saw her and she should be out of her med but no follow seems to have been kept.

## 2023-07-18 ENCOUNTER — Ambulatory Visit (HOSPITAL_COMMUNITY)
Admission: EM | Admit: 2023-07-18 | Discharge: 2023-07-18 | Disposition: A | Discharge status: Home / Self Care | Payer: BC Managed Care – PPO | Attending: Psychiatry | Admitting: Psychiatry

## 2023-07-18 DIAGNOSIS — F3130 Bipolar disorder, current episode depressed, mild or moderate severity, unspecified: Secondary | ICD-10-CM

## 2023-07-18 MED ORDER — SERTRALINE HCL 50 MG PO TABS: 50.0000 mg | ORAL_TABLET | Freq: Every day | ORAL | 0 refills | Status: DC

## 2023-07-18 NOTE — Discharge Instructions (Addendum)
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
## 2023-07-18 NOTE — Progress Notes (Signed)
   07/18/23 1515  [facility] Triage Screening (Walk-ins at [facility] only)  How Did You Hear About [place]? Family/Friend  What Is the Reason for Your Visit/Call Today? Pt. arrived to [facility] accompanied by her father. Pts states medication Hinda Glatter) is not working for her at this time. Pt. states she has used it for 3 months.  She has depression and anxiety, also lacks motivation. She states she has trouble with taking care of herself, daughter and household chores. She says her legs feels weak and she agitated.  How Long Has This Been Causing You Problems? 1-6 months  Have You Recently Had Any Thoughts About Hurting Yourself? No  Are You Planning to Commit Suicide/Harm Yourself At This time? No  Have you Recently Had Thoughts About Hurting Someone [name]? No  Are You Planning To Harm Someone At This Time? No  Are you currently experiencing any auditory, visual or other hallucinations? No  Have You Used Any Alcohol or Drugs in the Past 24 Hours? No  Do you have any current medical co-morbidities that require immediate attention? No  Clinician description of patient physical appearance/behavior: casually dressed, calm, soft spoken and cooperative  What Do You Feel Would Help You the Most Today? Medication(s)  If access to [facility] was not available, would you have sought care in the Emergency Department? Yes  Determination of Need Routine (7 days)  Options For Referral Medication Management

## 2023-08-17 ENCOUNTER — Ambulatory Visit (HOSPITAL_COMMUNITY)
Admission: EM | Admit: 2023-08-17 | Discharge: 2023-08-17 | Disposition: A | Discharge status: Home / Self Care | Payer: BC Managed Care – PPO | Attending: Psychiatry | Admitting: Psychiatry

## 2023-08-17 DIAGNOSIS — F319 Bipolar disorder, unspecified: Secondary | ICD-10-CM

## 2023-08-17 NOTE — Progress Notes (Signed)
   08/17/23 1428  [facility] Triage Screening (Walk-ins at [facility] only)  How Did You Hear About [place]? Family/Friend  What Is the Reason for Your Visit/Call Today? Pt presents to [facility] voluntarily accompanied by her husband and daughter seeking medication management.Pt reports she was prescribed Zoloft 1 month ago at [facility] and believes the Zoloft is not working properly. Pt receives outpatient therapy at [facility], her last appointment was 2 weeks ago. She also prescribed an Invega injection every 3 months and her last injection was 1 month ago.  She reports lack of motivation, difficulty completing tasks, difficulty retaining information, increased sleep, decreased appetite.Pt denies SI/HI and AVH.  How Long Has This Been Causing You Problems? 1 wk - 1 month  Have You Recently Had Any Thoughts About Hurting Yourself? No  Are You Planning to Commit Suicide/Harm Yourself At This time? No  Have you Recently Had Thoughts About Hurting Someone [name]? No  Are You Planning To Harm Someone At This Time? No  Are you currently experiencing any auditory, visual or other hallucinations? No  Have You Used Any Alcohol or Drugs in the Past 24 Hours? No  Do you have any current medical co-morbidities that require immediate attention? No  Clinician description of patient physical appearance/behavior: nervous, cooperative, soft spoken, fairly groomed  What Do You Feel Would Help You the Most Today? Medication(s)  If access to [facility] was not available, would you have sought care in the Emergency Department? No  Determination of Need Routine (7 days)  Options For Referral Medication Management

## 2023-08-17 NOTE — ED Notes (Signed)
Patient  sleeping in no acute stress. RR even and unlabored .Environment secured .Will continue to monitor for safely. 

## 2023-08-17 NOTE — ED Provider Notes (Cosign Needed Addendum)
Behavioral Health Urgent Care Medical Screening Exam  Patient Name: Marie Rice MRN: [id number] Date of Evaluation: 08/17/23 Chief Complaint:  "I am lacking motivation and energy" Diagnosis:  Final diagnoses:  Bipolar depression (HCC)   History of Present illness:  Marie Rice is a 37 y.o. female, with a history of bipolar disorder, presents today voluntarily, or requesting medication management.  Marie Rice, 37 y.o., female patient seen face-to- face by this provider, consulted with Dr. Lucianne Muss; and chart reviewed on 08/17/23.   Marie Rice is followed by [facility] for psychiatry Briant Sites, and she receives counseling services through the same agency.   She reports since March experiencing worsening depression, lack of motivation and physical energy and unable to complete tasks. She reports typically struggling with mania type  symptoms and she misses feeling energetic and goal directed.  Patient reports never having a bout of bipolar depression as she has had since March of this year.  She reports no motivation to get up and out of work although she continues to work, she reports there is loads of laundry piled up as she has no energy to complete daily chores or task, and reports sleeping on average 9 to 10 hours per night.  She feels that the depression has worsened since she was placed on Invega during her last psychiatric hospitalization which was approximately 3 months ago while in [place].  Patient reports prior to being psychiatrically admitted 3 months ago she became very manic as she was working on different projects for work and recalls missing a few days of sleep which subsequently put her over the edge and caused her to experience mania.  She reports prior to the hospitalization she has been prescribed risperidone on and off since 2017 which was her first mental health break.  She reports during a subsequent mental health break  in 2020 is when she was diagnosed with bipolar 1 disorder.  She endorses 1 attempted suicide in which she had threatened to jump off her balcony several years back and was able to be coached down and to come back in the house by her husband.  She was psychiatrically admitted after that suicide attempt, but endorses the depression was only there for approximately a day and then she transition to mania.  She reports most of the time she can manage her manic episodes although endorses during a few of her manic episodes developing paranoia and hallucinations after going without sleep for 2 days.  She endorses that when she is able to force herself to sleep during her manic episode she feels that the mania symptoms are beneficial that she is able to get several things accomplished.  She reports being placed on Invega as during several of her manic episode she has thrown her psychiatric medications away and the psychiatrist treating her in [place] felt it would be more appropriate for her to start a long-acting injection.  She reports when taking risperidone she did not have any symptoms of anhedonia or increased sleep.  She also endorses bilateral leg tremors which have been present for several months although now she feels she is unable to fully control the shaking of her legs. Currently,  she is not prescribed or taking any medication to manage involuntary movements.  When asked about current suicidal ideations patient reports that she has verbalized and had thought that that life would be better for her family if she was no longer here but denies any active thoughts of ending her life and states "  I would never go through with anything like that".  Patient denies any active or previous history of homicidal ideations.  Denies currently experiencing any visual auditory hallucinations. Patient currently reside with her husband and 109-year-old daughter. She has an appointment with her [facility] psychiatrist for  medication management tomorrow.  Also when reviewing previously and current medications patient reports that during her last psychiatric admission in [place] the psychiatrist did discharge her with Depakote along with initiating the Invega injection.  Patient reports that her aunt had spoken with her psychiatrist and was concerned that she was laughing too much while taking the Depakote and the medication was subsequently discontinued without introducing another mood stabilizer.  Patient reports she did not feel any ill effects when taking the Depakote.  Patient is of childbearing age but reports she is not having any more children and is open to restarting a mood stabilizer as she feels the antidepressant she has been taking for one month is not helping with her depression symptoms.   Discussed with patient the options of inpatient for medication management and patient doesn't feel that she is a safety risk and is able to contract for safety. Patient and this Clinical research associate discussed the option of seeing psychiatrist tomorrow to discuss medication changes and recommendations to restart a mood stabilizer vs. Adjusting SSRI. Also discussed if symptoms worsen or if suicidal thoughts become more frequent and or if develops a plan, inpatient treatment for medication management would be recommended. Discussed recommendation with patient's husband who brought her in the clinic. Patient and spouse verbalized agreement and spouse will bring patient back to [facility] if any new concerns arise.    Flowsheet Row ED from 08/17/2023 in [facility] ED from 07/18/2023 in [facility] ED from 03/12/2023 in [facility]  C-SSRS RISK CATEGORY No Risk No Risk No Risk       Psychiatric Specialty Exam  Presentation  General Appearance:Appropriate for Environment  Eye Contact:Good  Speech:Clear and Coherent  Speech  Volume:Normal  Handedness:Right   Mood and Affect  Mood: Depressed  Affect: Congruent   Thought Process  Thought Processes: Coherent; Disorganized  Descriptions of Associations:Intact  Orientation:Full (Time, Place and Person)  Thought Content:Logical  Diagnosis of Schizophrenia or Schizoaffective disorder in past: No  Duration of Psychotic Symptoms: Greater than six months  Hallucinations:None  Ideas of Reference:None  Suicidal Thoughts:Yes, Passive (Sometime when I get down, I tell my family I think it would be better if I am not here". "I have no plan and will not go throught with it" "I just want to feel better".) Without Intent; Without Plan  Homicidal Thoughts:No   Sensorium  Memory: Immediate Good  Judgment: Fair  Insight: Good   Executive Functions  Concentration: Good  Attention Span: Good  Recall: Good  Fund of Knowledge: Good  Language: Fair   Psychomotor Activity  Psychomotor Activity: Other (comment) (leg tremors)   Assets  Assets: Communication Skills; Desire for Improvement; Resilience; Social Support; Physical Health; Housing   Sleep  Sleep: Good  Number of hours:  9.5   Physical Exam: Physical Exam Vitals reviewed.  Constitutional:      Appearance: Normal appearance.  HENT:     Head: Normocephalic and atraumatic.  Eyes:     Extraocular Movements: Extraocular movements intact.     Pupils: Pupils are equal, round, and reactive to light.  Cardiovascular:     Rate and Rhythm: Normal rate and regular rhythm.  Pulmonary:     Effort: Pulmonary effort is normal.     Breath sounds: Normal breath sounds.  Neurological:     General: No focal deficit present.     Mental Status: She is alert and oriented to person, place, and time.    Review of Systems  Psychiatric/Behavioral:  Positive for depression and suicidal ideas. The patient has insomnia.     Blood pressure 112/70, pulse 95, temperature 98.5 F (36.9  C), temperature source Oral, resp. rate 18, SpO2 99%. There is no height or weight on file to calculate BMI.  Musculoskeletal: Strength & Muscle Tone: within normal limits Gait & Station: normal Patient leans: N/A   [facility] MSE Discharge Disposition for Follow up and Recommendations: Based on my evaluation the patient does not appear to have an emergency medical condition and can be discharged with resources and follow up care with outpatient mental health provider at [facility] to discuss concerns and medication management. Strict return precautions given any new mental health crisis develop or if current symptom worsen.  Joaquin Courts, NP 08/17/2023, 4:10 PM

## 2023-08-17 NOTE — Discharge Instructions (Addendum)
Please discuss with your psychiatric tomorrow options for a mood stabilizer (restarting Depakote or starting Lamictal) or increasing Zoloft to improve overall symptoms of depression.  For leg restlessness, you can take a low dose of Diphenhydramine (Benadryl) 25 mg at bedtime to minimize symptoms. Keep follow-up scheduled tomorrow. If your depressive symptoms do not improve or medication continues to be ineffective, recommend returning to consider inpatient admission for medication management.  -If you have any thoughts of suicide with a plan or thoughts of harming anyone else, return here immediately or call [phone].

## 2023-09-07 ENCOUNTER — Ambulatory Visit (HOSPITAL_COMMUNITY)
Admission: EM | Admit: 2023-09-07 | Discharge: 2023-09-07 | Disposition: A | Discharge status: Other Acute Inpatient Hospital | Payer: BC Managed Care – PPO

## 2023-09-07 ENCOUNTER — Emergency Department (HOSPITAL_COMMUNITY)
Admission: EM | Admit: 2023-09-07 | Discharge: 2023-09-08 | Disposition: A | Discharge status: Home / Self Care | Payer: BC Managed Care – PPO | Source: Home / Self Care | Attending: Emergency Medicine | Admitting: Emergency Medicine

## 2023-09-07 ENCOUNTER — Other Ambulatory Visit: Payer: Self-pay

## 2023-09-07 DIAGNOSIS — T50902D Poisoning by unspecified drugs, medicaments and biological substances, intentional self-harm, subsequent encounter: Secondary | ICD-10-CM | POA: Diagnosis not present

## 2023-09-07 DIAGNOSIS — J45909 Unspecified asthma, uncomplicated: Secondary | ICD-10-CM | POA: Insufficient documentation

## 2023-09-07 DIAGNOSIS — T50901A Poisoning by unspecified drugs, medicaments and biological substances, accidental (unintentional), initial encounter: Secondary | ICD-10-CM | POA: Insufficient documentation

## 2023-09-07 DIAGNOSIS — F3130 Bipolar disorder, current episode depressed, mild or moderate severity, unspecified: Secondary | ICD-10-CM | POA: Diagnosis not present

## 2023-09-07 DIAGNOSIS — R45851 Suicidal ideations: Secondary | ICD-10-CM | POA: Insufficient documentation

## 2023-09-07 DIAGNOSIS — F332 Major depressive disorder, recurrent severe without psychotic features: Secondary | ICD-10-CM | POA: Insufficient documentation

## 2023-09-07 DIAGNOSIS — T1491XA Suicide attempt, initial encounter: Secondary | ICD-10-CM

## 2023-09-07 LAB — CBC
HCT: 38.6 % (ref 36.0–46.0)
Hemoglobin: 11.8 g/dL — ABNORMAL LOW (ref 12.0–15.0)
MCH: 27.6 pg (ref 26.0–34.0)
MCHC: 30.6 g/dL (ref 30.0–36.0)
MCV: 90.4 fL (ref 80.0–100.0)
Platelets: 284 10*3/uL (ref 150–400)
RBC: 4.27 MIL/uL (ref 3.87–5.11)
RDW: 14.4 % (ref 11.5–15.5)
WBC: 5.4 10*3/uL (ref 4.0–10.5)
nRBC: 0 % (ref 0.0–0.2)

## 2023-09-07 LAB — RAPID URINE DRUG SCREEN, HOSP PERFORMED
Amphetamines: NOT DETECTED
Barbiturates: NOT DETECTED
Benzodiazepines: POSITIVE — AB
Cocaine: NOT DETECTED
Opiates: NOT DETECTED
Tetrahydrocannabinol: NOT DETECTED

## 2023-09-07 LAB — COMPREHENSIVE METABOLIC PANEL
ALT: 16 U/L (ref 0–44)
AST: 19 U/L (ref 15–41)
Albumin: 4 g/dL (ref 3.5–5.0)
Alkaline Phosphatase: 62 U/L (ref 38–126)
Anion gap: 7 (ref 5–15)
BUN: <5 mg/dL — ABNORMAL LOW (ref 6–20)
CO2: 27 mmol/L (ref 22–32)
Calcium: 9.2 mg/dL (ref 8.9–10.3)
Chloride: 104 mmol/L (ref 98–111)
Creatinine, Ser: 0.79 mg/dL (ref 0.44–1.00)
GFR, Estimated: >60 mL/min (ref >60)
Glucose, Bld: 82 mg/dL (ref 70–99)
Potassium: 3.4 mmol/L — ABNORMAL LOW (ref 3.5–5.1)
Sodium: 138 mmol/L (ref 135–145)
Total Bilirubin: 0.5 mg/dL (ref 0.3–1.2)
Total Protein: 7.5 g/dL (ref 6.5–8.1)

## 2023-09-07 LAB — SALICYLATE LEVEL: Salicylate Lvl: <7 mg/dL — ABNORMAL LOW (ref 7.0–30.0)

## 2023-09-07 LAB — ETHANOL: Alcohol, Ethyl (B): <10 mg/dL (ref <10)

## 2023-09-07 LAB — ACETAMINOPHEN LEVEL: Acetaminophen (Tylenol), Serum: <10 ug/mL — ABNORMAL LOW (ref 10–30)

## 2023-09-07 LAB — PREGNANCY, URINE: Preg Test, Ur: NEGATIVE

## 2023-09-07 NOTE — ED Notes (Signed)
Pt belonging placed in belongings bag. 1 shirt 1 pair of pants 2 shoes

## 2023-09-07 NOTE — Discharge Instructions (Signed)
Transfer to [facility] for medical clearance,  Dr. Jeraldine Loots is the accepting MD.

## 2023-09-07 NOTE — ED Triage Notes (Signed)
PT BIB [facility] from [facility]. With reports of attempted suicide by taking 7 Benadryl and 1 "sleeping pill."

## 2023-09-07 NOTE — Progress Notes (Signed)
   09/07/23 1347  [facility] Triage Screening (Walk-ins at [facility] only)  How Did You Hear About [place]? Family/Friend  What Is the Reason for Your Visit/Call Today? Pt presents to [facility] voluntarily accompanied by her husband due to a reported suicide attempt. Pt reports she took a hand full of benadryl and another unknown pill in an attempt to kill herself, about 45 minutes ago. NP Notified. Pt asked this writer "will it kill me, is it going to work?". Pt denies any symptoms at this time. Pts husband also reports another suicide attempt on Saturday where the pt attempted to overdose on benadryl, but was not hospitalized at that time. He reports her last hospitalization was in April at a psychiatric facility in [place]. Pt denies HI and AVH currently.  How Long Has This Been Causing You Problems? <Week  Have You Recently Had Any Thoughts About Hurting Yourself? Yes  How long ago did you have thoughts about hurting yourself? 45 minutes ago, attempted suicide  Are You Planning to Commit Suicide/Harm Yourself At This time? Yes  Have you Recently Had Thoughts About Hurting Someone [name]? No  Are You Planning To Harm Someone At This Time? No  Are you currently experiencing any auditory, visual or other hallucinations? No  Have You Used Any Alcohol or Drugs in the Past 24 Hours? No  Do you have any current medical co-morbidities that require immediate attention? No  Clinician description of patient physical appearance/behavior: flat affect, well groomed  What Do You Feel Would Help You the Most Today? Treatment for Depression or other mood problem  If access to [facility] was not available, would you have sought care in the Emergency Department? No  Determination of Need Emergent (2 hours)  Options For Referral ED Visit;Inpatient Hospitalization

## 2023-09-07 NOTE — ED Provider Notes (Signed)
[place] EMERGENCY DEPARTMENT AT [facility] Provider Note   CSN: [id number] Arrival date & time: 09/07/23  1509     History  Chief Complaint  Patient presents with   Drug Overdose   Suicidal    Marie Rice is a 37 y.o. female.   Drug Overdose  Patient transferred from [facility] behavioral health urgent care.  Overdosed on Benadryl and a sleeping pill.  Patient initially did not know the announcement is told nursing 7 Benadryl and 1 sleeping pill.  States it dated about an hour prior to arrival and did not attempt herself.  Reportedly also had another attempt on Saturday with today being Monday.    Past Medical History:  Diagnosis Date   Asthma    Bipolar 1 disorder (HCC)     Home Medications Prior to Admission medications   Medication Sig Start Date End Date Taking? Authorizing Provider  albuterol (VENTOLIN HFA) 108 (90 Base) MCG/ACT inhaler Inhale 2 puffs into the lungs every 6 (six) hours as needed for wheezing.    [provider]  risperiDONE (RISPERDAL) 1 MG tablet Take 1 tablet (1 mg total) by mouth 2 (two) times daily. 03/12/23 03/11/24  Lenard Lance, FNP  sertraline (ZOLOFT) 50 MG tablet Take 1 tablet (50 mg total) by mouth daily. 07/18/23 07/17/24  Marlou Sa, NP      Allergies    Patient has no allergy information on record.    Review of Systems   Review of Systems  Physical Exam Updated Vital Signs BP 98/63   Pulse 91   Temp 98.7 F (37.1 C)   Resp 16   SpO2 99%  Physical Exam Vitals and nursing note reviewed.  Cardiovascular:     Rate and Rhythm: Normal rate and regular rhythm.  Musculoskeletal:        General: No tenderness.  Skin:    General: Skin is warm.  Neurological:     Mental Status: She is alert. Mental status is at baseline.     ED Results / Procedures / Treatments   Labs (all labs ordered are listed, but only abnormal results are displayed) Labs Reviewed  COMPREHENSIVE METABOLIC  PANEL - Abnormal; Notable for the following components:      Result Value   Potassium 3.4 (*)    BUN <5 (*)    All other components within normal limits  ACETAMINOPHEN LEVEL - Abnormal; Notable for the following components:   Acetaminophen (Tylenol), Serum <10 (*)    All other components within normal limits  SALICYLATE LEVEL - Abnormal; Notable for the following components:   Salicylate Lvl <7.0 (*)    All other components within normal limits  CBC - Abnormal; Notable for the following components:   Hemoglobin 11.8 (*)    All other components within normal limits  RAPID URINE DRUG SCREEN, HOSP PERFORMED - Abnormal; Notable for the following components:   Benzodiazepines POSITIVE (*)    All other components within normal limits  ETHANOL  PREGNANCY, URINE    EKG EKG Interpretation Date/Time:  Monday September 07 2023 15:33:06 EDT Ventricular Rate:  82 PR Interval:  116 QRS Duration:  80 QT Interval:  358 QTC Calculation: 418 R Axis:   52  Text Interpretation: Normal sinus rhythm Normal ECG No previous ECGs available Confirmed by Benjiman Core [phone]) on 09/07/2023 5:45:51 PM  Radiology No results found.  Procedures Procedures    Medications Ordered in ED Medications - No data to display  ED Course/  Medical Decision Making/ A&P                                 Medical Decision Making Amount and/or Complexity of Data Reviewed Labs: ordered.   Patient presents with overdose.  Reportedly at least 7 Benadryl tabs and 1 "sleeping pill."Unknown what that pill was reportedly got in [place] and was prescribed..  Was done and admitted suicide attempt and will require inpatient treatment, however will need medical clearance here.  Will get EKG and cardiac monitoring.  Will get basic blood work.  Reviewed note from behavioral health urgent care.  Blood work reassuring.  Has been stable during the 4 hours of monitoring.  Doubt severe overdose at this time.  Medically  cleared for psychiatric treatment.        Final Clinical Impression(s) / ED Diagnoses Final diagnoses:  Suicide attempt [facility])    Rx / DC Orders ED Discharge Orders     None         Benjiman Core, MD 09/07/23 [id number]

## 2023-09-07 NOTE — ED Provider Notes (Signed)
Behavioral Health Urgent Care Medical Screening Exam  Patient Name: Marie Rice MRN: [id number] Date of Evaluation: 09/07/23 Chief Complaint:  suicide attempt via over dose  Diagnosis:  Final diagnoses:  Suicide attempt by drug ingestion, subsequent encounter    History of Present illness: Marie Rice is a 37 y.o. female patient presented to [facility] as a walk in  accompanied by her spouse after consuming a handful of unknown medications this a.m. and a suicide attempt.  Marie Rice, 37 y.o., female patient seen face to face by this provider, consulted with Dr. Lucianne Muss; and chart reviewed on 09/07/23.  Per chart review patient has a psychiatric history that includes bipolar affective disorder and paranoia.  She has been hospitalized while in [place] roughly 2 months ago, [place], and in [place].  Patient is seated in the assessment room with her spouse.  She is alert/oriented x 4, calm, cooperative.  She is inattentive and has an extremely flat affect.  She looks to her spouse often when answering questions as if she cannot answer on her own.  She endorses suicidal ideations with intent, plan, and access to means.  Today she took a handful of Benadryl and other unknown pills in an attempt to kill herself roughly 45 minutes before presentation.  Per triage [facility] patient asked "will it kill me, is it going to work?".  In addition she reports another suicide attempt on Saturday where she attempted to overdose on Benadryl.  She did not seek medical attention at that time.  She has a friend who is a Designer, jewellery who monitored her.  Patient reports that she was hospitalized in [place] roughly 2 months ago and while there she received the [place] every 10-month injection.  Reports since that time she has felt an increase in her suicidal ideations. She is denying homicidal/auditory/visual hallucinations.  She currently does not appear to be responding to  internal/external stimuli.  She does appear paranoid.  Discussed transferring patient to the [facility] emergency department for medical clearance, she is reluctant but agrees.  Informed her that she would be recommended for inpatient psychiatric admission.    Consulted Dr. Jeraldine Loots at [facility] ED, he has agreed to accept patient.   Flowsheet Row ED from 08/17/2023 in [facility] ED from 07/18/2023 in [facility] ED from 03/12/2023 in [facility]  C-SSRS RISK CATEGORY No Risk No Risk No Risk       Psychiatric Specialty Exam  Presentation  General Appearance:Bizarre  Eye Contact:Fair  Speech:Slow; Clear and Coherent  Speech Volume:Normal  Handedness:Right   Mood and Affect  Mood: Depressed  Affect: Flat   Thought Process  Thought Processes: Coherent  Descriptions of Associations:Intact  Orientation:Full (Time, Place and Person)  Thought Content:Illogical  Diagnosis of Schizophrenia or Schizoaffective disorder in past: No  Duration of Psychotic Symptoms: Greater than six months  Hallucinations:None  Ideas of Reference:None  Suicidal Thoughts:Yes, Active With Intent; With Plan; With Means to Carry Out Without Intent; Without Plan  Homicidal Thoughts:No   Sensorium  Memory: Immediate Good; Recent Good; Remote Good  Judgment: Impaired  Insight: Lacking   Executive Functions  Concentration: Poor  Attention Span: Poor  Recall: Poor  Fund of Knowledge: Poor  Language: Fair   Psychomotor Activity  Psychomotor Activity: Normal   Assets  Assets: Physical Health; Resilience; Social Support   Sleep  Sleep: -- (unkown)  Number of hours:  9.5   Physical Exam: Physical Exam  Constitutional:      Appearance: She is normal weight.  Cardiovascular:     Rate and Rhythm: Normal rate.  Pulmonary:     Effort: Pulmonary effort is normal. No respiratory  distress.  Musculoskeletal:     Cervical back: Normal range of motion.  Skin:    Coloration: Skin is not jaundiced or pale.  Neurological:     Mental Status: She is alert and oriented to person, place, and time.  Psychiatric:        Attention and Perception: She is inattentive. She does not perceive auditory hallucinations.        Mood and Affect: Mood is depressed. Affect is flat.        Behavior: Behavior is withdrawn.        Thought Content: Thought content includes suicidal ideation. Thought content includes suicidal plan.        Cognition and Memory: Cognition normal.        Judgment: Judgment is impulsive.    Review of Systems  Unable to perform ROS: Acuity of condition   Blood pressure 105/70, pulse 85, temperature 98.1 F (36.7 C), resp. rate 19, SpO2 100%. There is no height or weight on file to calculate BMI.  Musculoskeletal: Strength & Muscle Tone: within normal limits Gait & Station: normal Patient leans: N/A   [facility] MSE Discharge Disposition for Follow up and Recommendations: Based on my evaluation the patient appears to have an emergency medical condition for which I recommend the patient be transferred to the emergency department for further evaluation.   Discharge and transfer patient to the [facility] emergency department, Dr. Jeraldine Loots is the accepting MD due to drug overdose.  Patient will be recommended for inpatient psychiatric admission.  Please place patient on TTS consult list   Ardis Hughs, NP 09/07/2023, 2:39 PM

## 2023-09-08 ENCOUNTER — Encounter (HOSPITAL_COMMUNITY): Payer: Self-pay | Admitting: Nurse Practitioner

## 2023-09-08 ENCOUNTER — Inpatient Hospital Stay (HOSPITAL_COMMUNITY)
Admission: AD | Admit: 2023-09-08 | Discharge: 2023-09-14 | DRG: 885 | Disposition: A | Discharge status: Home / Self Care | Payer: BC Managed Care – PPO | Source: Intra-hospital | Attending: Psychiatry | Admitting: Psychiatry

## 2023-09-08 ENCOUNTER — Other Ambulatory Visit: Payer: Self-pay

## 2023-09-08 DIAGNOSIS — J45909 Unspecified asthma, uncomplicated: Secondary | ICD-10-CM | POA: Diagnosis present

## 2023-09-08 DIAGNOSIS — T1491XA Suicide attempt, initial encounter: Secondary | ICD-10-CM | POA: Diagnosis not present

## 2023-09-08 DIAGNOSIS — G4700 Insomnia, unspecified: Secondary | ICD-10-CM | POA: Diagnosis present

## 2023-09-08 DIAGNOSIS — Z9151 Personal history of suicidal behavior: Secondary | ICD-10-CM

## 2023-09-08 DIAGNOSIS — R634 Abnormal weight loss: Secondary | ICD-10-CM | POA: Diagnosis present

## 2023-09-08 DIAGNOSIS — Z79899 Other long term (current) drug therapy: Secondary | ICD-10-CM | POA: Diagnosis not present

## 2023-09-08 DIAGNOSIS — G4710 Hypersomnia, unspecified: Secondary | ICD-10-CM | POA: Diagnosis present

## 2023-09-08 DIAGNOSIS — F411 Generalized anxiety disorder: Secondary | ICD-10-CM | POA: Diagnosis present

## 2023-09-08 DIAGNOSIS — F319 Bipolar disorder, unspecified: Secondary | ICD-10-CM | POA: Diagnosis not present

## 2023-09-08 DIAGNOSIS — Z8659 Personal history of other mental and behavioral disorders: Secondary | ICD-10-CM

## 2023-09-08 DIAGNOSIS — F3130 Bipolar disorder, current episode depressed, mild or moderate severity, unspecified: Principal | ICD-10-CM | POA: Diagnosis present

## 2023-09-08 DIAGNOSIS — Z6825 Body mass index (BMI) 25.0-25.9, adult: Secondary | ICD-10-CM

## 2023-09-08 DIAGNOSIS — Z98891 History of uterine scar from previous surgery: Secondary | ICD-10-CM | POA: Diagnosis not present

## 2023-09-08 DIAGNOSIS — T43595A Adverse effect of other antipsychotics and neuroleptics, initial encounter: Secondary | ICD-10-CM | POA: Diagnosis present

## 2023-09-08 DIAGNOSIS — N643 Galactorrhea not associated with childbirth: Secondary | ICD-10-CM | POA: Diagnosis present

## 2023-09-08 DIAGNOSIS — Z5989 Other problems related to housing and economic circumstances: Secondary | ICD-10-CM

## 2023-09-08 DIAGNOSIS — T450X2A Poisoning by antiallergic and antiemetic drugs, intentional self-harm, initial encounter: Secondary | ICD-10-CM | POA: Diagnosis present

## 2023-09-08 DIAGNOSIS — R7989 Other specified abnormal findings of blood chemistry: Secondary | ICD-10-CM | POA: Insufficient documentation

## 2023-09-08 MED ORDER — ACETAMINOPHEN 325 MG PO TABS: 650.0000 mg | ORAL_TABLET | Freq: Four times a day (QID) | ORAL | Status: DC | PRN

## 2023-09-08 MED ORDER — LORAZEPAM 2 MG/ML IJ SOLN: 2.0000 mg | Freq: Three times a day (TID) | INTRAMUSCULAR | Status: DC | PRN

## 2023-09-08 MED ORDER — ALUM & MAG HYDROXIDE-SIMETH 200-200-20 MG/5ML PO SUSP: 30.0000 mL | ORAL | Status: DC | PRN

## 2023-09-08 MED ORDER — TRAZODONE HCL 50 MG PO TABS
50.0000 mg | ORAL_TABLET | Freq: Every evening | ORAL | Status: DC | PRN
  Administered 2023-09-08 – 2023-09-11 (×2): 50 mg via ORAL
  Filled 2023-09-08 (×3): qty 1

## 2023-09-08 MED ORDER — HYDROXYZINE HCL 25 MG PO TABS
25.0000 mg | ORAL_TABLET | Freq: Three times a day (TID) | ORAL | Status: DC | PRN
  Administered 2023-09-08: 25 mg via ORAL
  Filled 2023-09-08 (×4): qty 1

## 2023-09-08 MED ORDER — HALOPERIDOL 5 MG PO TABS: 5.0000 mg | ORAL_TABLET | Freq: Three times a day (TID) | ORAL | Status: DC | PRN

## 2023-09-08 MED ORDER — HALOPERIDOL LACTATE 5 MG/ML IJ SOLN: 5.0000 mg | Freq: Three times a day (TID) | INTRAMUSCULAR | Status: DC | PRN

## 2023-09-08 MED ORDER — MAGNESIUM HYDROXIDE 400 MG/5ML PO SUSP: 30.0000 mL | Freq: Every day | ORAL | Status: DC | PRN

## 2023-09-08 MED ORDER — LORAZEPAM 1 MG PO TABS: 2.0000 mg | ORAL_TABLET | Freq: Three times a day (TID) | ORAL | Status: DC | PRN

## 2023-09-08 NOTE — ED Provider Notes (Signed)
Emergency Medicine Observation Re-evaluation Note  Marie Rice is a 37 y.o. female, seen on rounds today.  Pt initially presented to the ED for complaints of Drug Overdose and Suicidal Currently, the patient is awaiting for [facility] admit.  Physical Exam  BP 119/78 (BP Location: Left Arm)   Pulse (!) 111   Temp 98.3 F (36.8 C) (Oral)   Resp 18   SpO2 100%  Physical Exam General: Calm Cardiac: Well perfused Lungs: Even respirations Psych: Calm  ED Course / MDM  EKG:EKG Interpretation Date/Time:  Monday September 07 2023 15:33:06 EDT Ventricular Rate:  82 PR Interval:  116 QRS Duration:  80 QT Interval:  358 QTC Calculation: 418 R Axis:   52  Text Interpretation: Normal sinus rhythm Normal ECG No previous ECGs available Confirmed by Benjiman Core [phone]) on 09/07/2023 5:45:51 PM  I have reviewed the labs performed to date as well as medications administered while in observation.  Recent changes in the last 24 hours include patient accepted to [facility]. Dr. Sherron Flemings accepting.   Plan  Current plan is for [facility] admit.    Maia Plan, MD 09/08/23 1400

## 2023-09-08 NOTE — Progress Notes (Signed)
Pt was accepted to [facility] TODAY9/24/2024;PENDING TODAY's discharges and Signed Voluntary consent uploaded to chart or faxed to [facility] [phone]  Pt meets inpatient criteria per Julaine Fusi  Attending Physician will be Dr. Phineas Inches, MD   Report can be called to: -Adult unit: [phone]  Pt can arrive after: [facility] will coordinate with care team.  Care Team notified:Day [facility] Danika Riley,RN, Night [facility] Zachary George, [place], RN, [place], [facility], [address] [place], [place] 09/08/2023 @ 6:54 AM

## 2023-09-08 NOTE — BH Assessment (Signed)
Comprehensive Clinical Assessment (CCA) Note  09/08/2023 Marie Rice [id number]  Disposition: Marie Rice recommends inpatient psychiatric admission. CSW to seek placement. Disposition discussed with Marie Sickle, RN. Per Marie Rice, [facility], RN pt has been accepted to [facility] pending discharges, day shift AC to follow up.   The patient demonstrates the following risk factors for suicide: Chronic risk factors for suicide include: psychiatric disorder of Major Depressive Disorder, recurrent, severe . Acute risk factors for suicide include:  Pt thinks her medication may trigger her depression . Protective factors for this patient include: positive social support. Considering these factors, the overall suicide risk at this point appears to be high. Patient is not appropriate for outpatient follow up.  Marie Rice is a 37 year old female who presents voluntary and unaccompanied to [facility]. Clinician  asked the pt, "what brought you to the hospital?" Pt reports, she was taken to [facility] by her husband but was transferred to [facility] ED for medical clearance. Pt reports, she was having thoughts of suicide for over a month. Pt reports, she doesn't know if her medication is a trigger to her suicidal thoughts. Per pt, she's been sleeping more, feels sad, does not like to do things she enjoy (cooking and cleaning), she doesn't feel like herself. Pt reports, she took pills and told her husband. Pt denies, HI, AVH, self-injurious behaviors and access to weapons.  Pt denies, substance use. Pt's UDS is positive for Benzodiazepines. Pt is linked to Marie Rice for medication management in [place]. Pt reports, she's prescribed an Invega (280mg ) injection every three months. Pt reports, she flies to [place] every three months for her injection. Pt reports, she's had one inpatient hospitalization in [place] and two hospitalizations while in [place].   Pt presents quiet, awake in  scrubs with soft speech. Pt's mood was depressed. Pt's affect was flat. Pt's insight was fair. Pt's judgment was poor.  Chief Complaint:  Chief Complaint  Patient presents with   Drug Overdose   Suicidal   Visit Diagnosis: Major Depressive Disorder, recurrent, severe.    CCA Screening, Triage and Referral (STR)  Patient Reported Information How did you hear about [place]? Family/Friend  What Is the Reason for Your Visit/Call Today? Pt was brought in by her husband to [facility] (09/07/2023) but was transferred to [facility] ED for medical clearance, after intentional overdose Benadryl and sleeping pills.)  How Long Has This Been Causing You Problems? <Week  What Do You Feel Would Help You the Most Today? Medication(s); Stress Management   Have You Recently Had Any Thoughts About Hurting Yourself? Yes  Are You Planning to Commit Suicide/Harm Yourself At This time? Yes   Flowsheet Row ED from 09/07/2023 in [facility] Department [facility] Most recent reading at 09/08/2023  3:05 AM ED from 09/07/2023 in [facility] Most recent reading at 09/07/2023  2:47 PM ED from 08/17/2023 in [facility] Most recent reading at 08/17/2023  2:51 PM  C-SSRS RISK CATEGORY High Risk High Risk No Risk       Have you Recently Had Thoughts About Hurting Someone Marie Rice? No  Are You Planning to Harm Someone at This Time? No  Explanation: Pt denies, HI.   Have You Used Any Alcohol or Drugs in the Past 24 Hours? No  What Did You Use and How Much? Pt denies, substance use.   Do You Currently Have a Therapist/Psychiatrist? Yes  Name of Therapist/Psychiatrist: Name  of Therapist/Psychiatrist: Pt is linked to Marie Rice in [place]. Pt reports, she's prescribed an Invega (280mg ) injection every three months. Pt reports, she flies to [place] every three months for her injection.   Have You Been Recently Discharged From Any Office  Practice or Programs? No  Explanation of Discharge From Practice/Program: None.     CCA Screening Triage Referral Assessment Type of Contact: Tele-Assessment  Telemedicine Service Delivery: Telemedicine service delivery: This service was provided via telemedicine using a 2-way, interactive audio and video technology  Is this Initial or Reassessment? Is this Initial or Reassessment?: Initial Assessment  Date Telepsych consult ordered in [facility]:  Date Telepsych consult ordered in [facility]: 09/07/23  Time Telepsych consult ordered in [facility]:  Time Telepsych consult ordered in [facility]: 1855  Location of Assessment: [facility] ED  Provider Location: [facility] Assessment Services   Collateral Involvement: None.   Does Patient Have a Automotive engineer Guardian? No  Legal Guardian Contact Information: Pt is her own guardian.  Copy of Legal Guardianship Form: -- (Pt is her own guardian.)  Legal Guardian Notified of Arrival: -- (Pt is her own guardian.)  Legal Guardian Notified of Pending Discharge: -- (Pt is her own guardian.)  If Minor and Not Living with Parent(s), Who has Custody? Pt is an adult.  Is CPS involved or ever been involved? Never  Is APS involved or ever been involved? Never   Patient Determined To Be At Risk for Harm To Self or Others Based on Review of Patient Reported Information or Presenting Complaint? Yes, for Self-Harm  Method: Plan with intent and identified person  Availability of Means: In hand or used  Intent: Clearly intends on inflicting harm that could cause death  Notification Required: No need or identified person  Additional Information for Danger to Others Potential: -- (Pt denies, HI.)  Additional Comments for Danger to Others Potential: Pt denies, HI.  Are There Guns or Other Weapons in Your Home? No  Types of Guns/Weapons: Pt denies, access to weapons.  Are These Weapons Safely Secured?                            -- (Pt denies, access to  weapons.)  Who Could Verify You Are Able To Have These Secured: Pt denies, access to weapons.  Do You Have any Outstanding Charges, Pending Court Dates, Parole/Probation? Pt denies, legal involvement.  Contacted To Inform of Risk of Harm To Self or Others: Other: Comment (None.)    Does Patient Present under Involuntary Commitment? No    [place] of Residence: [place]   Patient Currently Receiving the Following Services: Medication Management   Determination of Need: Emergent (2 hours)   Options For Referral: Inpatient Hospitalization; Medication Management; Outpatient Therapy; BH Urgent Care     CCA Biopsychosocial Patient Reported Schizophrenia/Schizoaffective Diagnosis in Past: No   Strengths: Pt has good support system.   Mental Health Symptoms Depression:   Difficulty Concentrating; Fatigue; Hopelessness; Worthlessness; Sleep (too much or little) (Isolation, despondent.)   Duration of Depressive symptoms:  Duration of Depressive Symptoms: Greater than two weeks   Mania:   None   Anxiety:    Irritability; Worrying   Psychosis:   None   Duration of Psychotic symptoms:    Trauma:   None   Obsessions:   None   Compulsions:   None   Inattention:   Forgetful; Disorganized   Hyperactivity/Impulsivity:   Feeling of restlessness; Fidgets with hands/feet  Oppositional/Defiant Behaviors:   -- Software engineer.)   Emotional Irregularity:   Recurrent suicidal behaviors/gestures/threats   Other Mood/Personality Symptoms:   Symptoms of depression.    Mental Status Exam Appearance and self-care  Stature:   Average   Weight:   Average weight   Clothing:   -- (Pt in scrubs.)   Grooming:   Normal   Cosmetic use:   None   Posture/gait:   Normal   Motor activity:   Not Remarkable   Sensorium  Attention:   Normal   Concentration:   Normal   Orientation:   X5   Recall/memory:   Normal   Affect and Mood  Affect:   Flat    Mood:   Depressed   Relating  Eye contact:   Normal   Facial expression:   Depressed   Attitude toward examiner:   Cooperative   Thought and Language  Speech flow:  Soft   Thought content:   Appropriate to Mood and Circumstances   Preoccupation:   None   Hallucinations:   None   Organization:   Coherent   [employer] of Knowledge:   Fair   Intelligence:   Average   Abstraction:   Functional   Judgement:   Poor   Reality Testing:   Adequate   Insight:   Fair   Decision Making:   Impulsive   Social Functioning  Social Maturity:   Impulsive   Social Judgement:   Heedless   Stress  Stressors:   Other (Comment) (Pt reports, she's not herself, she sleeps all the time, not wanting to do the things she enjoys.)   Coping Ability:   Overwhelmed   Skill Deficits:   Self-control   Supports:   Family     Religion: Religion/Spirituality Are You A Religious Person?:  (Pt reports, she's spiritual.) How Might This Affect Treatment?: None.  Leisure/Recreation: Leisure / Recreation Do You Have Hobbies?: No  Exercise/Diet: Exercise/Diet Do You Exercise?: No Have You Gained or Lost A Significant Amount of Weight in the Past Six Months?: No Do You Follow a Special Diet?: No Do You Have Any Trouble Sleeping?: No (Pt sleep has increased.)   CCA Employment/Education Employment/Work Situation: Employment / Work Situation Employment Situation: Employed Work Stressors: Pt denies, work stressors. Patient's Job has Been Impacted by Current Illness: No Has Patient ever Been in the [employer]?: No  Education: Education Is Patient Currently Attending School?: No Last Grade Completed: 12 Did You Attend College?: Yes What Type of College Degree Do you Have?: Pt reports, she graduated from the [employer] [place] for [employer]. Did You Have An Individualized Education Program (IIEP): No Did You Have Any  Difficulty At School?: No Patient's Education Has Been Impacted by Current Illness: No   CCA Family/Childhood History Family and Relationship History: Family history Marital status: Married Number of Years Married: 7 What types of issues is patient dealing with in the relationship?: Pt reports, getting along with her husband. Additional relationship information: None. Does patient have children?: Yes How many children?: 1 How is patient's relationship with their children?: Pt has a 30 year old daughter.  Childhood History:  Childhood History By whom was/is the patient raised?: Both parents (Per chart.) Did patient suffer any verbal/emotional/physical/sexual abuse as a child?: No Did patient suffer from severe childhood neglect?: No Has patient ever been sexually abused/assaulted/raped as an adolescent or adult?: No Was the patient ever a victim of a crime or a disaster?:  No Witnessed domestic violence?: No Has patient been affected by domestic violence as an adult?: No   CCA Substance Use Alcohol/Drug Use: Alcohol / Drug Use Pain Medications: See MAR Prescriptions: See MAR Over the Counter: See MAR History of alcohol / drug use?: No history of alcohol / drug abuse Longest period of sobriety (when/how long): Pt denies, substance use. Negative Consequences of Use:  (None.) Withdrawal Symptoms: None    ASAM's:  Six Dimensions of Multidimensional Assessment  Dimension 1:  Acute Intoxication and/or Withdrawal Potential:      Dimension 2:  Biomedical Conditions and Complications:      Dimension 3:  Emotional, Behavioral, or Cognitive Conditions and Complications:     Dimension 4:  Readiness to Change:     Dimension 5:  Relapse, Continued use, or Continued Problem Potential:     Dimension 6:  Recovery/Living Environment:     ASAM Severity Score:    ASAM Recommended Level of Treatment:     Substance use Disorder (SUD)    Recommendations for  Services/Supports/Treatments: Recommendations for Services/Supports/Treatments Recommendations For Services/Supports/Treatments: Inpatient Hospitalization  Discharge Disposition: Discharge Disposition Medical Exam completed: Yes  DSM5 Diagnoses: There are no problems to display for this patient.    Referrals to Alternative Service(s): Referred to Alternative Service(s):   Place:   Date:   Time:    Referred to Alternative Service(s):   Place:   Date:   Time:    Referred to Alternative Service(s):   Place:   Date:   Time:    Referred to Alternative Service(s):   Place:   Date:   Time:     Redmond Pulling, [facility] Comprehensive Clinical Assessment (CCA) Screening, Triage and Referral Note  09/08/2023 Marie Rice [id number]  Chief Complaint:  Chief Complaint  Patient presents with   Drug Overdose   Suicidal   Visit Diagnosis:   Patient Reported Information How did you hear about [place]? Family/Friend  What Is the Reason for Your Visit/Call Today? Pt was brought in by her husband to [facility] (09/07/2023) but was transferred to [facility] ED for medical clearance, after intentional overdose Benadryl and sleeping pills.)  How Long Has This Been Causing You Problems? <Week  What Do You Feel Would Help You the Most Today? Medication(s); Stress Management   Have You Recently Had Any Thoughts About Hurting Yourself? Yes  Are You Planning to Commit Suicide/Harm Yourself At This time? Yes   Have you Recently Had Thoughts About Hurting Someone Marie Rice? No  Are You Planning to Harm Someone at This Time? No  Explanation: Pt denies, HI.   Have You Used Any Alcohol or Drugs in the Past 24 Hours? No  How Long Ago Did You Use Drugs or Alcohol? Pt denies, substance use. What Did You Use and How Much? Pt denies, substance use.   Do You Currently Have a Therapist/Psychiatrist? Yes  Name of Therapist/Psychiatrist: Pt is linked to Marie Rice in [place]. Pt reports, she's  prescribed an Invega (280mg ) injection every three months. Pt reports, she flies to [place] every three months for her injection.   Have You Been Recently Discharged From Any Office Practice or Programs? No  Explanation of Discharge From Practice/Program: None.    CCA Screening Triage Referral Assessment Type of Contact: Tele-Assessment  Telemedicine Service Delivery: Telemedicine service delivery: This service was provided via telemedicine using a 2-way, interactive audio and video technology  Is this Initial or Reassessment? Is this Initial or Reassessment?: Initial Assessment  Date Telepsych consult  ordered in [facility]:  Date Telepsych consult ordered in [facility]: 09/07/23  Time Telepsych consult ordered in [facility]:  Time Telepsych consult ordered in [facility]: 1855  Location of Assessment: [facility] ED  Provider Location: [facility] Assessment Services    Collateral Involvement: None.   Does Patient Have a Automotive engineer Guardian? No. Name and Contact of Legal Guardian: Pt is her own guardian. If Minor and Not Living with Parent(s), Who has Custody? Pt is an adult.  Is CPS involved or ever been involved? Never  Is APS involved or ever been involved? Never   Patient Determined To Be At Risk for Harm To Self or Others Based on Review of Patient Reported Information or Presenting Complaint? Yes, for Self-Harm  Method: Plan with intent and identified person  Availability of Means: In hand or used  Intent: Clearly intends on inflicting harm that could cause death  Notification Required: No need or identified person  Additional Information for Danger to Others Potential: -- (Pt denies, HI.)  Additional Comments for Danger to Others Potential: Pt denies, HI.  Are There Guns or Other Weapons in Your Home? No  Types of Guns/Weapons: Pt denies, access to weapons.  Are These Weapons Safely Secured?                            -- (Pt denies, access to weapons.)  Who Could Verify You Are Able  To Have These Secured: Pt denies, access to weapons.  Do You Have any Outstanding Charges, Pending Court Dates, Parole/Probation? Pt denies, legal involvement.  Contacted To Inform of Risk of Harm To Self or Others: Other: Comment (None.)   Does Patient Present under Involuntary Commitment? No    [place] of Residence: [place]   Patient Currently Receiving the Following Services: Medication Management   Determination of Need: Emergent (2 hours)   Options For Referral: Inpatient Hospitalization; Medication Management; Outpatient Therapy; [facility]   Discharge Disposition:  Discharge Disposition Medical Exam completed: Yes  Redmond Pulling, [facility]     Redmond Pulling, MS, [facility], [facility] Triage Specialist [phone]

## 2023-09-08 NOTE — Tx Team (Signed)
Initial Treatment Plan 09/08/2023 3:53 PM Marie Rice ONG:[id number]    PATIENT STRESSORS: Other: Lack of motivation and increased depression     PATIENT STRENGTHS: Capable of independent living  General fund of knowledge    PATIENT IDENTIFIED PROBLEMS:   "Lack of motivation"    "Increased depression since receiving Invega long acting injectable"    "Suicidal ideation"           DISCHARGE CRITERIA:  Improved stabilization in mood, thinking, and/or behavior  PRELIMINARY DISCHARGE PLAN: Outpatient therapy  PATIENT/FAMILY INVOLVEMENT: This treatment plan has been presented to and reviewed with the patient, Marie Rice.The patient has been given the opportunity to ask questions and make suggestions.  Earma Reading Byan Poplaski, RN 09/08/2023, 3:53 PM

## 2023-09-08 NOTE — BHH Group Notes (Signed)
Adult Psychoeducational Group Note  Date:  09/08/2023 Time:  9:39 PM  Group Topic/Focus:  Wrap-Up Group:   The focus of this group is to help patients review their daily goal of treatment and discuss progress on daily workbooks.  Participation Level:  Active  Participation Quality:  Appropriate  Affect:  Appropriate  Cognitive:  Appropriate  Insight: Appropriate  Engagement in Group:  Engaged  Modes of Intervention:  Discussion  Additional Comments:  Pt, stated day was 5, stated today's goal was to stop thinking about the things they can not change.Pt stated today goal was not achieved .  Joselyn Arrow 09/08/2023, 9:39 PM

## 2023-09-08 NOTE — BH Assessment (Signed)
Clinician messaged [name], RN: "Hey. It's Trey with TTS. Is the pt able to engage in the assessment, if so the pt will need to be placed in a private room. Is the pt under IVC? Also is the pt medically cleared?"    Clinician awaiting response.    Redmond Pulling, MS, [facility], [facility] Triage Specialist [phone]

## 2023-09-08 NOTE — Progress Notes (Signed)
Pt has been accepted to [facility] TODAY 09/08/2023, pending voluntary consent. Bed assignment: 303-1  Pt meets inpatient criteria per Eligha Bridegroom, NP  Attending Physician will be Phineas Inches, MD  Report can be called to: - Adult unit: [id number]  Pt can arrive after pending discharges  Care Team Notified: [facility] Rona Ravens, RN, Eligha Bridegroom, NP, Lorella Nimrod, RN, and Aletta Edouard, RN  [place], [place]  09/08/2023 9:47 AM

## 2023-09-08 NOTE — Progress Notes (Signed)
Denies

## 2023-09-08 NOTE — Plan of Care (Signed)
Problem: Education: Goal: Knowledge of Campus General Education information/materials will improve Outcome: Progressing Goal: Verbalization of understanding the information provided will improve Outcome: Progressing

## 2023-09-08 NOTE — Progress Notes (Signed)
BHH Admission Note:  Marie HutchinsonDan Humphreys is a 37 year old female voluntarily admitted to [facility] on 09/08/23 following a suicide attempt by taking a handful of benadryl and another unknown pill in an attempt to kill herself.   Patient reports that she has been feeling increasingly depressed and unmotivated following administration of Invega long acting injectable. Patient reports "I have no motivation to do anything. I used to be productive and now I can barely take care of myself. If I do not start to feel like the person that I used to be then I want to just kill myself".   Patient denies SI/HI/AVH on admission. Patient denies alcohol and drug use. Patient's UDS positive for benzodiazepines. Patient tearful throughout admission process. Patient's skin assessed, no notable findings recorded. Pt oriented to the unit. Q 15 minute safety checks in place.

## 2023-09-09 ENCOUNTER — Encounter (HOSPITAL_COMMUNITY): Payer: Self-pay

## 2023-09-09 DIAGNOSIS — T1491XA Suicide attempt, initial encounter: Secondary | ICD-10-CM | POA: Diagnosis not present

## 2023-09-09 MED ORDER — BUPROPION HCL ER (XL) 150 MG PO TB24
150.0000 mg | ORAL_TABLET | Freq: Every day | ORAL | Status: DC
  Administered 2023-09-10 – 2023-09-14 (×5): 150 mg via ORAL
  Filled 2023-09-09 (×9): qty 1

## 2023-09-09 NOTE — BH IP Treatment Plan (Signed)
Interdisciplinary Treatment and Diagnostic Plan Initial  09/09/2023 Time of Session: 1030 Marie Rice MRN: [id number]  Principal Diagnosis: Suicide attempt [facility])  Secondary Diagnoses: Principal Problem:   Suicide attempt (HCC)   Current Medications:  Current Facility-Administered Medications  Medication Dose Route Frequency Provider Last Rate Last Admin   acetaminophen (TYLENOL) tablet 650 mg  650 mg Oral Q6H PRN Eligha Bridegroom, NP       alum & mag hydroxide-simeth (MAALOX/MYLANTA) 200-200-20 MG/5ML suspension 30 mL  30 mL Oral Q4H PRN Eligha Bridegroom, NP       haloperidol (HALDOL) tablet 5 mg  5 mg Oral TID PRN Eligha Bridegroom, NP       Or   haloperidol lactate (HALDOL) injection 5 mg  5 mg Intramuscular TID PRN Eligha Bridegroom, NP       hydrOXYzine (ATARAX) tablet 25 mg  25 mg Oral TID PRN Eligha Bridegroom, NP   25 mg at 09/08/23 2112   LORazepam (ATIVAN) tablet 2 mg  2 mg Oral TID PRN Eligha Bridegroom, NP       Or   LORazepam (ATIVAN) injection 2 mg  2 mg Intramuscular TID PRN Eligha Bridegroom, NP       magnesium hydroxide (MILK OF MAGNESIA) suspension 30 mL  30 mL Oral Daily PRN Eligha Bridegroom, NP       traZODone (DESYREL) tablet 50 mg  50 mg Oral QHS PRN Eligha Bridegroom, NP   50 mg at 09/08/23 2112   PTA Medications: Medications Prior to Admission  Medication Sig Dispense Refill Last Dose   PRESCRIPTION MEDICATION Inject into the skin every 3 (three) months. Medication: Invega injection       Patient Stressors: Other: Lack of motivation and increased depression    Patient Strengths: Capable of independent living  General fund of knowledge   Treatment Modalities: Medication Management, Group therapy, Case management,  1 to 1 session with clinician, Psychoeducation, Recreational therapy.   Physician Treatment Plan for Primary Diagnosis: Suicide attempt [facility]) Long Term Goal(s):     Short Term Goals:    Medication Management: Evaluate patient's  response, side effects, and tolerance of medication regimen.  Therapeutic Interventions: 1 to 1 sessions, Unit Group sessions and Medication administration.  Evaluation of Outcomes: Progressing  Physician Treatment Plan for Secondary Diagnosis: Principal Problem:   Suicide attempt (HCC)  Long Term Goal(s):     Short Term Goals:       Medication Management: Evaluate patient's response, side effects, and tolerance of medication regimen.  Therapeutic Interventions: 1 to 1 sessions, Unit Group sessions and Medication administration.  Evaluation of Outcomes: Progressing   RN Treatment Plan for Primary Diagnosis: Suicide attempt [facility]) Long Term Goal(s): Knowledge of disease and therapeutic regimen to maintain health will improve  Short Term Goals: Ability to remain free from injury will improve, Ability to verbalize frustration and anger appropriately will improve, Ability to demonstrate self-control, Ability to participate in decision making will improve, Ability to verbalize feelings will improve, Ability to disclose and discuss suicidal ideas, Ability to identify and develop effective coping behaviors will improve, and Compliance with prescribed medications will improve  Medication Management: RN will administer medications as ordered by provider, will assess and evaluate patient's response and provide education to patient for prescribed medication. RN will report any adverse and/or side effects to prescribing provider.  Therapeutic Interventions: 1 on 1 counseling sessions, Psychoeducation, Medication administration, Evaluate responses to treatment, Monitor vital signs and CBGs as ordered, Perform/monitor CIWA, COWS, AIMS and Fall Risk screenings  as ordered, Perform wound care treatments as ordered.  Evaluation of Outcomes: Progressing   LCSW Treatment Plan for Primary Diagnosis: Suicide attempt [facility]) Long Term Goal(s): Safe transition to appropriate next level of care at discharge,  Engage patient in therapeutic group addressing interpersonal concerns.  Short Term Goals: Engage patient in aftercare planning with referrals and resources, Increase social support, Increase ability to appropriately verbalize feelings, Increase emotional regulation, Facilitate acceptance of mental health diagnosis and concerns, Facilitate patient progression through stages of change regarding substance use diagnoses and concerns, Identify triggers associated with mental health/substance abuse issues, and Increase skills for wellness and recovery  Therapeutic Interventions: Assess for all discharge needs, 1 to 1 time with Social worker, Explore available resources and support systems, Assess for adequacy in community support network, Educate family and significant other(s) on suicide prevention, Complete Psychosocial Assessment, Interpersonal group therapy.  Evaluation of Outcomes: Progressing   Progress in Treatment: Attending groups: Yes. Participating in groups: Yes. Taking medication as prescribed: Yes. Toleration medication: Yes. Family/Significant other contact made: No, will contact:  Marie Rice (husband) [phone] Patient understands diagnosis: Yes. Discussing patient identified problems/goals with staff: Yes. Medical problems stabilized or resolved: Yes. Denies suicidal/homicidal ideation: Yes. Issues/concerns per patient self-inventory: Yes. Other: N/A  New problem(s) identified: No, Describe:  None  New Short Term/Long Term Goal(s):medication stabilization, elimination of SI thoughts, development of comprehensive mental wellness plan.     Patient Goals:  Medication Stabilization  Discharge Plan or Barriers: Patient recently admitted. CSW will continue to follow and assess for appropriate referrals and possible discharge planning.     Reason for Continuation of Hospitalization: Anxiety Depression Mania Medication stabilization Suicidal ideation  Estimated Length of  Stay: 5-7 Days  Last 3 [place] Suicide Severity Risk Score: Flowsheet Row [facility] (Current[facility] Most recent reading at 09/08/2023  4:00 PM ED from 09/07/2023 in [facility] Emergency Department at [facility] Most recent reading at 09/08/2023  3:05 AM ED from 09/07/2023 in [facility] Most recent reading at 09/07/2023  2:47 PM  C-SSRS RISK CATEGORY Error: Q3, 4, or 5 should not be populated when Q2 is No High Risk High Risk       Last PHQ 2/9 Scores:    07/18/2023    4:21 PM 11/05/2021   10:33 AM  Depression screen PHQ 2/9  Decreased Interest 2 0  Down, Depressed, Hopeless 3 0  PHQ - 2 Score 5 0  Altered sleeping 2 0  Tired, decreased energy 2 0  Change in appetite 2 0  Feeling bad or failure about yourself  2 0  Trouble concentrating 1 0  Moving slowly or fidgety/restless 1 1  Suicidal thoughts 0 0  PHQ-9 Score 15 1  Difficult doing work/chores Very difficult Not difficult at all    medication stabilization, elimination of SI thoughts, development of comprehensive mental wellness plan.   Scribe for Treatment Team: Ane Payment, LCSW 09/09/2023 1:13 PM

## 2023-09-09 NOTE — [facility] Suicide Risk Assessment (Signed)
[facility] Admission Suicide Risk Assessment   Nursing information obtained from:    Demographic factors:  NA Current Mental Status:  NA Loss Factors:  NA Historical Factors:  NA Risk Reduction Factors:  NA  Total Time spent with patient: 30 minutes Principal Problem: Suicide attempt [facility]) Diagnosis:  Principal Problem:   Suicide attempt (HCC)  Subjective Data: See H&P Pt is depressed, still having suicidal thoughts, with intermittent plan, no intent at this time. Able to contract for safety. Depression 2/2 bipolar disorder decompensation vs excess dopamine blocking.   Continued Clinical Symptoms:  Alcohol Use Disorder Identification Test Final Score (AUDIT): 0 The "Alcohol Use Disorders Identification Test", Guidelines for Use in Primary Care, Second Edition.  World Science writer [facility]). Score between 0-7:  no or low risk or alcohol related problems. Score between 8-15:  moderate risk of alcohol related problems. Score between 16-19:  high risk of alcohol related problems. Score 20 or above:  warrants further diagnostic evaluation for alcohol dependence and treatment.   CLINICAL FACTORS:   Severe Anxiety and/or Agitation Bipolar Disorder:   Depressive phase More than one psychiatric diagnosis Unstable or Poor Therapeutic Relationship Previous Psychiatric Diagnoses and Treatments    Psychiatric Specialty Exam:  Presentation  General Appearance:  Disheveled  Eye Contact: Fair  Speech: Slow  Speech Volume: Decreased  Handedness: Right   Mood and Affect  Mood: Depressed  Affect: Depressed; Flat   Thought Process  Thought Processes: Linear  Descriptions of Associations:Intact  Orientation:Full (Time, Place and Person)  Thought Content:Logical  History of Schizophrenia/Schizoaffective disorder:No  Duration of Psychotic Symptoms:Greater than six months  Hallucinations:Hallucinations: None  Ideas of Reference:None  Suicidal Thoughts:Suicidal  Thoughts: Yes, Passive SI Active Intent and/or Plan: Without Intent; Without Plan  Homicidal Thoughts:Homicidal Thoughts: No   Sensorium  Memory: Immediate Fair; Recent Fair; Remote Fair  Judgment: Impaired  Insight: Lacking   Executive Functions  Concentration: Poor  Attention Span: Poor  Recall: [employer] of Knowledge: Fair  Language: Fair   Psychomotor Activity  Psychomotor Activity: Psychomotor Activity: Normal   Assets  Assets: Physical Health; Resilience; Social Support   Sleep  Sleep: Sleep: Fair    Physical Exam: Physical Exam See H&P  ROS See H&P   Blood pressure 113/71, pulse (!) 122, temperature 98.1 F (36.7 C), temperature source Oral, resp. rate 18, height 5\' 4"  (1.626 m), weight 66.7 kg, SpO2 100%. Body mass index is 25.23 kg/m.   COGNITIVE FEATURES THAT CONTRIBUTE TO RISK:  None    SUICIDE RISK:   Severe:  Frequent, intense, and enduring suicidal ideation, specific plan, no subjective intent, but some objective markers of intent (i.e., choice of lethal method), the method is accessible, some limited preparatory behavior, evidence of impaired self-control, severe dysphoria/symptomatology, multiple risk factors present, and few if any protective factors, particularly a lack of social support.  PLAN OF CARE: See H&P    I certify that inpatient services furnished can reasonably be expected to improve the patient's condition.   Cristy Hilts, MD 09/09/2023, 2:30 PM

## 2023-09-09 NOTE — Progress Notes (Signed)
Adult Psychoeducational Group Note  Date:  09/09/2023 Time:  9:32 PM  Group Topic/Focus:  Wrap-Up Group:   The focus of this group is to help patients review their daily goal of treatment and discuss progress on daily workbooks.  Participation Level:  Active  Participation Quality:  Appropriate  Affect:  Appropriate  Cognitive:  Appropriate  Insight: Appropriate  Engagement in Group:  Engaged  Modes of Intervention:  Activity  Additional Comments:  Pt attended and participated in NA group.  Zakia Sainato Katrinka Blazing 09/09/2023, 9:32 PM

## 2023-09-09 NOTE — [facility] Counselor (Signed)
Group Topic/Focus:  Goals Group:   The focus of this group is to help patients establish daily goals to achieve during treatment and discuss how the patient can incorporate goal setting into their daily lives to aide in recovery.       Participation Level:  Active   Participation Quality:  Attentive   Affect:  Appropriate   Cognitive:  Appropriate   Insight: Appropriate   Engagement in Group:  Engaged   Modes of Intervention:  Discussion   Additional Comments:   Patient attended goals group and was attentive the duration of it. Patient's goal was Group Topic/Focus:  Goals Group:   The focus of this group is to help patients establish daily goals to achieve during treatment and discuss how the patient can incorporate goal setting into their daily lives to aide in recovery.       Participation Level:  Active   Participation Quality:  Attentive   Affect:  Appropriate   Cognitive:  Appropriate   Insight: Appropriate   Engagement in Group:  Engaged   Modes of Intervention:  Discussion   Additional Comments:   Patient attended goals group and was attentive the duration of it. Patient's goal was to stop thinking about the things she cannot control. Pt has no feelings of wanting to hurt herself or others.

## 2023-09-09 NOTE — H&P (Signed)
Psychiatric Admission Assessment Adult  Patient Identification: Marie Rice MRN:  [id number] Date of Evaluation:  09/09/2023  Chief Complaint:  Suicide attempt (HCC) [T14.91XA],  Suicide attempt [facility])  Principal Problem:   Suicide attempt [facility])   History of Present Illness:  Marie Rice is a 37 y.o., female with a past psychiatric history of bipolar disorder  who presents to the [facility] Voluntary from the behavioral health urgent care [facility]) for evaluation and management of a recent intentional benadryl overdose and suicidal thoughts.  "I have been feeling sad and depressed." Patient reported she had increased sleep, decreased pleasure in her activities, and had difficulty getting out of bed. She stated she feels hopeless, sad, and had decreased energy, which prevent her from doing the activities she wants to do, such as chores, going to work, and caring for her family. She reported feeling constantly depressed since April, and it progressively worsened. She remarked that this is the first time she has been depressed. Recently she has had thoughts about not wanting to live anymore, and that her plan would be to overdose on pills. According to chart review, patient attempted to overdose on benadryl on 9/21 and intentionally ingested a handful of benadryl and another unknown pill.   Overall, the patient wants to be motivated again and stated, "I feel better in a manic or hypomanic state." During our interview, the patient ruminates on the need to not feel stuck and depressed, and she also was focused on a medicine to help increase her dopamine. The patient believes these symptoms were related to her long acting injection of Invega that is administered every 3 months,  that she last received a few months ago. Additionally, she reports finances are a recent major stressor: She expressed that she recently bought a house and is upset with the cost of  maintaining it.She reports that usually she is motivated and proactive to do activities around the house   This morning she reported her mood as depressed and rated her depression as a 10/10. She rated her anxiety 10/10 because she was worried she may stay depressed forever. She denied panic attacks/symptoms. Reported sleeping too much, having decreased interest in the things that bring her joy, having guilt, and decreased energy. No change in her appetite. Pt also reported an unintentional 5 pound weight loss in the past month. Additionally, she denied manic symptoms, such as increased risky behavior, grandiosity, talkativeness. Denied OCD, PTSD, trauma, flashbacks/nightmares. She was passively suicidal and felt "like I don't want to be living anymore". Denied HI, AH, VH, paranoia, delusions.   Chart review: On chart review, prior to this evaluation, patient presented to [facility] voluntarily via her husband because she attempted to kill herself with an intentional overdose of benadryl.   Subjective Sleep past 24 hours: fair, increased Subjective Appetite past 24 hours: good  Need to obtain Collateral information Delmar Landau, patient's husband [phone]) Patient granted permission to speak to contact person without restrictions. Unable to reach husband. Will try again tomorrow.  Past Psychiatric History:  - Previous psych diagnoses: Bipolar affective disorder, paranoia - Prior inpatient psychiatric treatment: Reports three previous psychiatric hospitalizations: most recent in [place] around 2 months ago, [place], and in [place].  - Prior outpatient psychiatric treatment: Previously with [facility]. Reports not seeing them anymore because they recommended she get more intensive counseling/therapy than what they provide. - Prior psychiatric provider: [place]  Neuromodulation history: denies  Current therapist: Has outpatient appointment set up for 9/30 to establish  care. Psychotherapy hx: Denies  History of suicide attempts: Denies History of homicide: Denies  Psychotropic medications: Current Invega long acting injection - patient reportedly taking consistently, reports adverse effect, namely depression x 4-5 months.  Past Risperdal 1 mg - patient reports good response. Patient reports discontinuing because she became manic and threw away all her medicines. Subsequently, she was started on the Invega LAI that is dosed to be given every three months.  Substance Use History: Alcohol: never drinks Tobacco: never smoked, vaped, or chewed tobacco Cannabis (marijuana): never tried Cocaine: never tried Methamphetamines: never tried Psilocybin (mushrooms): never tried Ecstasy (MDMA / molly): never tried LSD (acid): never tried Opiates (fentanyl / heroin): never tried Benzos (Xanax, Klonopin):  Denies. UDS  + for benzodiazepine in the ED during this admission IV drug use: denies Prescribed meds abuse: denies  History of detox: denies History of rehab: denies  Is the patient at risk to self? Yes Has the patient been a risk to self in the past 6 months? Yes Has the patient been a risk to self within the distant past? No Is the patient a risk to others? Yes Has the patient been a risk to others in the past 6 months? No Has the patient been a risk to others within the distant past? No  Tobacco Screening: n/a   Substance Abuse History in the last 12 months:  intentional overdose of benadryl and unknwon sleeping pill.  Allergies: patient endorses no known allergies to medications  Past Medical/Surgical History:  Medical Diagnoses: None Home Rx: None Prior Hosp: Most recent pt hospitalized in [place] approximately 5 months ago.  Prior Surgeries / non-head trauma: Denies  Head trauma: denies LOC: denies Concussions: denies Seizures: denies  Last menstrual period and contraceptives:  FDLMP last week, not on any contraceptives  Family  History:  Medical: Denies Psych: Denies Psych Rx: Denies Suicide: Denies Homicide: Denies Substance use family hx: Denies  Social History:  Place of birth and grew up where: From [place] Abuse: no history of abuse Marital Status: married to husband x 8 years Sexual orientation: straight Children: 4 y.o. daughter Employment: employed as Copywriter, advertising Highest level of education: bachelors degree Housing: living with husband and son in house Finances: Pt worried about finances after she recently bought a house. She is upset it is costly to maintain Legal: no legal issues Military: never served Consulting civil engineer: denies owning any firearms Pills stockpile:  Unknown   Lab Results:  Results for orders placed or performed during the hospital encounter of 09/07/23 (from the past 48 hour(s))  Urine rapid drug screen (hosp performed)     Status: Abnormal   Collection Time: 09/07/23  4:34 PM  Result Value Ref Range   Opiates NONE DETECTED NONE DETECTED   Cocaine NONE DETECTED NONE DETECTED   Benzodiazepines POSITIVE (A) NONE DETECTED   Amphetamines NONE DETECTED NONE DETECTED   Tetrahydrocannabinol NONE DETECTED NONE DETECTED   Barbiturates NONE DETECTED NONE DETECTED    Comment: (NOTE) DRUG SCREEN FOR MEDICAL PURPOSES ONLY.  IF CONFIRMATION IS NEEDED FOR ANY PURPOSE, NOTIFY LAB WITHIN 5 DAYS.  LOWEST DETECTABLE LIMITS FOR URINE DRUG SCREEN Drug Class                     Cutoff (ng/mL) Amphetamine and metabolites    1000 Barbiturate and metabolites    200 Benzodiazepine                 200 Opiates and metabolites  300 Cocaine and metabolites        300 THC                            50 Performed at [facility], [address], [place], [place] [address]   Pregnancy, urine     Status: None   Collection Time: 09/07/23  4:34 PM  Result Value Ref Range   Preg Test, Ur NEGATIVE NEGATIVE    Comment:        THE SENSITIVITY OF THIS METHODOLOGY IS >25 mIU/mL. Performed  at [facility], [address] [place], [place] [address]     Blood Alcohol level:  Lab Results  Component Value Date   ETH <10 09/07/2023    Metabolic Disorder Labs:  No results found for: "HGBA1C", "MPG" No results found for: "PROLACTIN" No results found for: "CHOL", "TRIG", "HDL", "CHOLHDL", "VLDL", "LDLCALC"  Current Medications: Current Facility-Administered Medications  Medication Dose Route Frequency Provider Last Rate Last Admin   acetaminophen (TYLENOL) tablet 650 mg  650 mg Oral Q6H PRN Eligha Bridegroom, NP       alum & mag hydroxide-simeth (MAALOX/MYLANTA) 200-200-20 MG/5ML suspension 30 mL  30 mL Oral Q4H PRN Eligha Bridegroom, NP       [START ON 09/10/2023] buPROPion (WELLBUTRIN XL) 24 hr tablet 150 mg  150 mg Oral Daily Massengill, Nathan, MD       haloperidol (HALDOL) tablet 5 mg  5 mg Oral TID PRN Eligha Bridegroom, NP       Or   haloperidol lactate (HALDOL) injection 5 mg  5 mg Intramuscular TID PRN Eligha Bridegroom, NP       hydrOXYzine (ATARAX) tablet 25 mg  25 mg Oral TID PRN Eligha Bridegroom, NP   25 mg at 09/08/23 2112   LORazepam (ATIVAN) tablet 2 mg  2 mg Oral TID PRN Eligha Bridegroom, NP       Or   LORazepam (ATIVAN) injection 2 mg  2 mg Intramuscular TID PRN Eligha Bridegroom, NP       magnesium hydroxide (MILK OF MAGNESIA) suspension 30 mL  30 mL Oral Daily PRN Eligha Bridegroom, NP       traZODone (DESYREL) tablet 50 mg  50 mg Oral QHS PRN Eligha Bridegroom, NP   50 mg at 09/08/23 2112    PTA Medications: Medications Prior to Admission  Medication Sig Dispense Refill Last Dose   PRESCRIPTION MEDICATION Inject into the skin every 3 (three) months. Medication: Invega injection       Physical Findings: AIMS: No  Psychiatric Specialty Exam: General Appearance:  Disheveled   Eye Contact:  Fair   Speech:  Slow   Volume:  Decreased   Mood:  Depressed   Affect:  Depressed; Flat   Thought Content:  Logical   Suicidal Thoughts:  Suicidal Thoughts: Yes, Passive SI Active Intent and/or Plan: Without Intent; Without Plan   Homicidal Thoughts: Homicidal Thoughts: No   Thought Process:  Linear, ruminating on her worry that she will not get better and anxious about her finances   Orientation:  Full (Time, Place and Person)     Memory:  Immediate Fair; Recent Fair; Remote Fair   Judgment:  Impaired   Insight:  Lacking   Concentration:  Poor   Recall:  Fair   Fund of Knowledge:  Fair   Language:  Fair   Psychomotor Activity: Psychomotor Activity: Normal   Assets:  Physical Health;  Resilience; Social Support   Sleep: Sleep: Fair     Review of Systems Review of Systems  Constitutional:  Positive for malaise/fatigue and weight loss (approximately 5 pounds in 1 month).  Respiratory:  Negative for cough and shortness of breath.   Cardiovascular:  Negative for chest pain.  Gastrointestinal:  Negative for abdominal pain, nausea and vomiting.  Neurological:  Negative for dizziness and headaches.    Vital signs: Blood pressure 111/72, pulse (!) 113, temperature 98.1 F (36.7 C), temperature source Oral, resp. rate 18, height 5\' 4"  (1.626 m), weight 66.7 kg, SpO2 100%. Body mass index is 25.23 kg/m. Physical Exam Vitals reviewed.  Constitutional:      General: She is not in acute distress.    Appearance: She is not ill-appearing or toxic-appearing.  HENT:     Head: Normocephalic and atraumatic.  Pulmonary:     Effort: Pulmonary effort is normal.  Neurological:     General: No focal deficit present.     Mental Status: She is alert and oriented to person, place, and time.     Motor: No weakness.     Gait: Gait normal.     Assets  Assets:Physical Health; Resilience; Social Support   Treatment Plan Summary: Daily contact with patient to assess and evaluate symptoms and progress in treatment and medication management  ASSESSMENT: Bipolar disorder with acute severe  depression Substance-induced depression Recent suicide attempt via intentional overdose on Benadryl   PLAN: Safety and Monitoring:  -- Voluntary admission to inpatient psychiatric unit for safety, stabilization and treatment  -- Daily contact with patient to assess and evaluate symptoms and progress in treatment  -- Patient's case to be discussed in multi-disciplinary team meeting  -- Observation Level : q15 minute checks  -- Vital signs: q12 hours  -- Precautions: suicide, elopement, and assault  2. Interventions (medications, psychoeducation, etc):  Pt has a history of bipolar disorder usually presenting with mania and is currently in a depressive episode. Additionally, she had two recent suicide attempts by intentional drug overdose.  Patient presents with antipsychotic negative symptoms that seem similar to the masked facies seen in Parkinsonism. Consider management with less dopamine blockade.  -- Start Wellbutrin XL 150 mg daily   -- f/u A1c, lipid panel, BMP  -- f/u with husband for collateral information. According to patient he plans on visiting her this evening and bringing her more clothes.   PRN medications for symptomatic management:              -- start acetaminophen 650 mg every 6 hours as needed for mild to moderate pain, fever, and headaches              -- start hydroxyzine 25 mg three times a day as needed for anxiety              -- start bismuth subsalicylate 524 mg oral chewable tablet every 3 hours as needed for diarrhea / loose stools              -- start senna 8.6 mg oral at bedtime and polyethylene glycol 17 g oral daily as needed for mild to moderate constipation              -- start aluminum-magnesium hydroxide + simethicone 30 mL every 4 hours as needed for heartburn or indigestion              -- start trazodone 50 mg at bedtime as needed for insomnia  -- As  needed agitation protocol in-place  The risks/benefits/side-effects/alternatives to the above  medication were discussed in detail with the patient and time was given for questions. The patient consents to medication trial. FDA black box warnings, if present, were discussed.  The patient is agreeable with the medication plan, as above. We will monitor the patient's response to pharmacologic treatment, and adjust medications as necessary.  3. Routine and other pertinent labs: EKG monitoring: QTc:   Metabolism / endocrine: BMI: Body mass index is 25.23 kg/m. Prolactin: No results found for: "PROLACTIN" Lipid Panel: No results found for: "CHOL", "TRIG", "HDL", "CHOLHDL", "VLDL", "LDLCALC" HbgA1c: No results found for: "HGBA1C" TSH: No results found for: "TSH"  Drugs of Abuse     Component Value Date/Time   LABOPIA NONE DETECTED 09/07/2023 1634   COCAINSCRNUR NONE DETECTED 09/07/2023 1634   LABBENZ POSITIVE (A) 09/07/2023 1634   AMPHETMU NONE DETECTED 09/07/2023 1634   THCU NONE DETECTED 09/07/2023 1634   LABBARB NONE DETECTED 09/07/2023 1634     4. Group Therapy:  -- Encouraged patient to participate in unit milieu and in scheduled group therapies   -- Short Term Goals: Ability to identify changes in lifestyle to reduce recurrence of condition, verbalize feelings, identify and develop effective coping behaviors, maintain clinical measurements within normal limits, and identify triggers associated with substance abuse/mental health issues will improve. Improvement in ability to disclose and discuss suicidal ideas, demonstrate self-control, and comply with prescribed medications.  -- Long Term Goals: Improvement in symptoms so as ready for discharge -- Patient is encouraged to participate in group therapy while admitted to the psychiatric unit. -- We will address other chronic and acute stressors, which contributed to the patient's Suicide attempt [facility]) in order to reduce the risk of self-harm at discharge.  5. Discharge Planning:   -- Social work and case management to assist  with discharge planning and identification of hospital follow-up needs prior to discharge  -- Estimated LOS: 5-7 days  -- Discharge Concerns: Need to establish a safety plan; Medication compliance and effectiveness  -- Discharge Goals: Return home with outpatient referrals for mental health follow-up including medication management/psychotherapy  I certify that inpatient services furnished can reasonably be expected to improve the patient's condition.  Signed: Milbert Coulter, Medical Student 09/09/2023, 4:26 PM

## 2023-09-09 NOTE — Progress Notes (Signed)
Suicide Risk Assessment  [facility] Admission Suicide Risk Assessment  Nursing information obtained from:    Demographic factors:  NA Current Mental Status:  NA Loss Factors:  NA Historical Factors:  NA Risk Reduction Factors:  NA  Total Time spent with patient: 45 minutes Principal Problem: Suicide attempt [facility]) Diagnosis:  Principal Problem:   Suicide attempt (HCC)   Subjective Data:   Chief Complaint:  Suicide attempt (HCC) [T14.91XA],  Suicide attempt (HCC)  Principal Problem:   Suicide attempt (HCC)   History of Present Illness:  Marie Rice is a 37 y.o., female with a past psychiatric history of bipolar disorder who presents to the [facility] Voluntary from the behavioral health urgent care [facility]) for evaluation and management of a recent intentional benadryl overdose and suicidal thoughts.  "I have been feeling sad and depressed." Patient reported she had increased sleep, decreased pleasure in her activities, and had difficulty getting out of bed. She stated she feels hopeless, sad, and had decreased energy, which prevent her from doing the activities she wants to do, such as chores, going to work, and caring for her family. She reported feeling constantly depressed since April, and it progressively worsened. She remarked that this is the first time she has been depressed. Recently she has had thoughts about not wanting to live anymore, and that her plan would be to overdose on pills. According to chart review, patient attempted to overdose on benadryl on 9/21 and intentionally ingested a handful of benadryl and another unknown pill.   Overall, the patient wants to be motivated again and stated, "I feel better in a manic or hypomanic state." During our interview, the patient ruminates on the need to not feel stuck and depressed, and she also was focused on a medicine to help increase her dopamine. The patient believes these symptoms were related to her long  acting injection of Invega that is administered every 3 months,  that she last received a few months ago. Additionally, she reports finances are a recent major stressor: She expressed that she recently bought a house and is upset with the cost of maintaining it.She reports that usually she is motivated and proactive to do activities around the house   This morning she reported her mood as depressed and rated her depression as a 10/10. She rated her anxiety 10/10 because she was worried she may stay depressed forever. She denied panic attacks/symptoms. Reported sleeping too much, having decreased interest in the things that bring her joy, having guilt, and decreased energy. No change in her appetite. Pt also reported an unintentional 5 pound weight loss in the past month. Additionally, she denied manic symptoms, such as increased risky behavior, grandiosity, talkativeness. Denied OCD, PTSD, trauma, flashbacks/nightmares. She was passively suicidal and felt "like I don't want to be living anymore". Denied HI, AH, VH, paranoia, delusions.   Chart review: On chart review, prior to this evaluation, patient presented to [facility] voluntarily via her husband because she attempted to kill herself with an intentional overdose of benadryl.   Subjective Sleep past 24 hours: fair, increased Subjective Appetite past 24 hours: good  Need to obtain Collateral information Delmar Landau, patient's husband [phone]) Patient granted permission to speak to contact person without restrictions. Unable to reach husband. Will try again tomorrow.  Past Psychiatric History:  - Previous psych diagnoses: Bipolar affective disorder, paranoia - Prior inpatient psychiatric treatment: Reports three previous psychiatric hospitalizations: most recent in [place] around 2 months ago, [place], and in [place].  - Prior outpatient psychiatric treatment: Previously with [facility]. Reports not seeing them anymore because  they recommended she get more intensive counseling/therapy than what they provide. - Prior psychiatric provider: [place]  Neuromodulation history: denies  Current therapist: Has outpatient appointment set up for 9/30 to establish care. Psychotherapy hx: Denies  History of suicide attempts: Denies History of homicide: Denies  Psychotropic medications: Current Invega long acting injection - patient reportedly taking consistently, reports adverse effect, namely depression x 4-5 months.  Past Risperdal 1 mg - patient reports good response. Patient reports discontinuing because she became manic and threw away all her medicines. Subsequently, she was started on the Invega LAI that is dosed to be given every three months.  Substance Use History: Alcohol: never drinks Tobacco: never smoked, vaped, or chewed tobacco Cannabis (marijuana): never tried Cocaine: never tried Methamphetamines: never tried Psilocybin (mushrooms): never tried Ecstasy (MDMA / molly): never tried LSD (acid): never tried Opiates (fentanyl / heroin): never tried Benzos (Xanax, Klonopin): Denies. UDS + for benzodiazepine in the ED during this admission IV drug use: denies Prescribed meds abuse: denies  History of detox: denies History of rehab: denies  Is the patient at risk to self? Yes Has the patient been a risk to self in the past 6 months? Yes Has the patient been a risk to self within the distant past? No Is the patient a risk to others? Yes Has the patient been a risk to others in the past 6 months? No Has the patient been a risk to others within the distant past? No  Tobacco Screening: n/a   Substance Abuse History in the last 12 months:  intentional overdose of benadryl and unknwon sleeping pill.  Allergies: patient endorses no known allergies to medications  Past Medical/Surgical History:  Medical Diagnoses: None Home Rx: None Prior Hosp: Most recent pt hospitalized in [place] approximately 5  months ago.  Prior Surgeries / non-head trauma: Denies  Head trauma: denies LOC: denies Concussions: denies Seizures: denies  Last menstrual period and contraceptives: FDLMP last week, not on any contraceptives  Family History:  Medical: Denies Psych: Denies Psych Rx: Denies Suicide: Denies Homicide: Denies Substance use family hx: Denies  Social History:  Place of birth and grew up where: From [place] Abuse: no history of abuse Marital Status: married to husband x 8 years Sexual orientation: straight Children: 4 y.o. daughter Employment: employed as Copywriter, advertising Highest level of education: bachelors degree Housing: living with husband and son in house Finances: Pt worried about finances after she recently bought a house. She is upset it is costly to maintain Legal: no legal issues Military: never served Consulting civil engineer: denies owning any firearms Pills stockpile:  Unknown  Continued Clinical Symptoms:  Alcohol Use Disorder Identification Test Final Score (AUDIT): 0 The "Alcohol Use Disorders Identification Test", Guidelines for Use in Primary Care, Second Edition.  World Science writer [facility]). Score between 0-7:  no or low risk or alcohol related problems. Score between 8-15:  moderate risk of alcohol related problems. Score between 16-19:  high risk of alcohol related problems. Score 20 or above:  warrants further diagnostic evaluation for alcohol dependence and treatment.  CLINICAL FACTORS:   Bipolar Disorder:    Previous Psychiatric Diagnoses and Treatments Medical Diagnoses and Treatments/Surgeries  Musculoskeletal: Strength & Muscle Tone: within normal limits Gait & Station: normal Patient leans: N/A  Psychiatric Specialty Exam  Psychiatric Specialty Exam: General Appearance:  Disheveled    Eye Contact:  Fair    Speech:  Slow, flat    Volume:  Decreased    Mood:  Depressed    Affect:  Depressed; Flat    Thought Content:  Logical     Suicidal Thoughts: Suicidal Thoughts: Yes, Passive SI Active Intent and/or Plan: Without Intent; Without Plan    Homicidal Thoughts: Homicidal Thoughts: No    Thought Process:  Linear, ruminating on her worry that she will not get better and anxious about her finances    Orientation:  Full (Time, Place and Person)      Memory:  Immediate Fair; Recent Fair; Remote Fair    Judgment:  Impaired    Insight:  Lacking    Concentration:  Poor    Recall:  Fair    Fund of Knowledge:  Fair    Language:  Fair    Psychomotor Activity: Psychomotor Activity: Normal    Assets:  Physical Health; Resilience; Social Support    Sleep: Sleep: Fair    Physical Exam: Vitals reviewed.  Constitutional:      General: She is not in acute distress.    Appearance: She is not ill-appearing or toxic-appearing.  HENT:     Head: Normocephalic and atraumatic.  Pulmonary:     Effort: Pulmonary effort is normal.  Neurological:     General: No focal deficit present.     Mental Status: She is alert and oriented to person, place, and time.     Motor: No weakness.     Gait: Gait normal.    Physical Exam Vitals reviewed.  Constitutional:      General: She is not in acute distress.    Appearance: She is not ill-appearing or toxic-appearing.  HENT:     Head: Normocephalic and atraumatic.  Pulmonary:     Effort: Pulmonary effort is normal.  Neurological:     General: No focal deficit present.     Mental Status: She is alert and oriented to person, place, and time.     Motor: No weakness.     Gait: Gait normal.  Blood pressure 111/72, pulse (!) 113, temperature 98.1 F (36.7 C), temperature source Oral, resp. rate 18, height 5\' 4"  (1.626 m), weight 66.7 kg, SpO2 100%. Body mass index is 25.23 kg/m.  COGNITIVE FEATURES THAT CONTRIBUTE TO RISK:  Thought constriction (tunnel vision)    SUICIDE RISK:   Mild:  Suicidal ideation of limited frequency, intensity, duration, and  specificity.  There are no identifiable plans, no associated intent, mild dysphoria and related symptoms, good self-control (both objective and subjective assessment), few other risk factors, and identifiable protective factors, including available and accessible social support.  PLAN OF CARE: see H&P for full plan of care  I certify that inpatient services furnished can reasonably be expected to improve the patient's condition.   Signed: Milbert Coulter, Medical Student 09/09/2023, 4:34 PM

## 2023-09-09 NOTE — Progress Notes (Signed)
   09/09/23 1000  Psych Admission Type (Psych Patients Only)  Admission Status Voluntary  Psychosocial Assessment  Patient Complaints Anxiety;Depression  Eye Contact Brief  Facial Expression Anxious  Affect Sad  Speech Soft;Slow  Interaction Cautious  Motor Activity Other (Comment) (WDL)  Appearance/Hygiene Unremarkable  Behavior Characteristics Cooperative  Mood Anxious;Depressed  Thought Process  Coherency WDL  Content WDL  Delusions None reported or observed  Perception WDL  Hallucination None reported or observed  Judgment Impaired  Confusion None  Danger to Self  Current suicidal ideation? Denies  Agreement Not to Harm Self Yes  Description of Agreement Verbal  Danger to Others  Danger to Others None reported or observed

## 2023-09-09 NOTE — Group Note (Signed)
Recreation Therapy Group Note   Group Topic:Communication  Group Date: 09/09/2023 Start Time: 0932 End Time: 1005 Facilitators: Shafter Jupin-McCall, LRT,CTRS Location: [address] Dayroom   Group Topic: Communication, Problem Solving   Goal Area(s) Addresses:  Patient will effectively listen to complete activity.  Patient will identify communication skills used to make activity successful.  Patient will identify how skills used during activity can be used to reach post d/c goals.    Intervention: Building surveyor Activity - Geometric pattern cards, pencils, blank paper    Group Description: Geometric Drawings.  Three volunteers from the peer group will be shown an abstract picture with a particular arrangement of geometrical shapes.  Each round, one 'speaker' will describe the pattern, as accurately as possible without revealing the image to the group.  The remaining group members will listen and draw the picture to reflect how it is described to them. Patients with the role of 'listener' cannot ask clarifying questions but, may request that the speaker repeat a direction. Once the drawings are complete, the presenter will show the rest of the group the picture and compare how close each person came to drawing the picture. LRT will facilitate a post-activity discussion regarding effective communication and the importance of planning, listening, and asking for clarification in daily interactions with others.  Education: Environmental consultant, Active listening, Support systems, Discharge planning  Education Outcome: Acknowledges understanding/In group clarification offered/Needs additional education.    Affect/Mood: N/A   Participation Level: Did not attend    Clinical Observations/Individualized Feedback:     Plan: Continue to engage patient in RT group sessions 2-3x/week.   Cellie Dardis-McCall, LRT,CTRS 09/09/2023 12:28 PM

## 2023-09-09 NOTE — BHH Counselor (Signed)
Adult Comprehensive Assessment  Patient ID: Marie Rice, female   DOB: 1986/09/22, 37 y.o.   MRN: [id number]  Information Source: Information source: Patient  Current Stressors:  Patient states their primary concerns and needs for treatment are:: "I have been really depressed and I was having a lot of SI then I decided to act on my thoughts." Patient states their goals for this hospitilization and ongoing recovery are:: "I want to start feeling better and beig more motivated." Educational / Learning stressors: None reported Employment / Job issues: none reported Family Relationships: none reported Surveyor, quantity / Lack of resources (include bankruptcy): none reported Housing / Lack of housing: none reported Physical health (include injuries & life threatening diseases): "I've just been really depressed and unmotivated" Social relationships: none reported Substance abuse: none reported Bereavement / Loss: none reported  Living/Environment/Situation:  Living Arrangements: Spouse/significant other, Children Who else lives in the home?: husband and 4 yo Careers information officer. How long has patient lived in current situation?: 1 year What is atmosphere in current home: Comfortable  Family History:  Marital status: Married Number of Years Married: 8 What types of issues is patient dealing with in the relationship?: Pt reports, getting along with her husband. Additional relationship information: NA Are you sexually active?: Yes What is your sexual orientation?: heterosexual Has your sexual activity been affected by drugs, alcohol, medication, or emotional stress?: NA Does patient have children?: Yes How many children?: 1 How is patient's relationship with their children?: Pt has a 35 year old daughter and reports a good relationship.  Childhood History:  By whom was/is the patient raised?: Both parents Description of patient's relationship with caregiver when they were a child:  good Patient's description of current relationship with people who raised him/her: good How were you disciplined when you got in trouble as a child/adolescent?: 'just talked to" Does patient have siblings?: Yes Number of Siblings: 4 Description of patient's current relationship with siblings: "I talk to them" Did patient suffer any verbal/emotional/physical/sexual abuse as a child?: No Did patient suffer from severe childhood neglect?: No Has patient ever been sexually abused/assaulted/raped as an adolescent or adult?: No Was the patient ever a victim of a crime or a disaster?: No Witnessed domestic violence?: No Has patient been affected by domestic violence as an adult?: No  Education:  Highest grade of school patient has completed: B.S. 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 Patient's Job has Been Impacted by Current Illness: No What is the Longest Time Patient has Held a Job?: NA Where was the Patient Employed at that Time?: NA  Financial Resources:   Financial resources: Income from employment Does patient have a representative payee or guardian?: No  Alcohol/Substance Abuse:   What has been your use of drugs/alcohol within the last 12 months?: "No" If attempted suicide, did drugs/alcohol play a role in this?: No Alcohol/Substance Abuse Treatment Hx: Denies past history If yes, describe treatment: NA Has alcohol/substance abuse ever caused legal problems?: No  Social Support System:   Patient's Community Support System: Good Describe Community Support System: "My husband, mom, dad, and sister" Type of faith/religion: christian How does patient's faith help to cope with current  illness?: "I go to church sometimes."  Leisure/Recreation:   Do You Have  Hobbies?: No  Strengths/Needs:   What is the patient's perception of their strengths?: "Cooking" Patient states they can use these personal strengths during their treatment to contribute to their recovery: NA Patient states these barriers may affect/interfere with their treatment: NA Patient states these barriers may affect their return to the community: "I'm just struggling with everything" Other important information patient would like considered in planning for their treatment: NA  Discharge Plan:   Currently receiving community mental health services: No Patient states concerns and preferences for aftercare planning are: Pt would like help getting setup for therapy and psychiatrist Patient states they will know when they are safe and ready for discharge when: NA Does patient have access to transportation?: Yes Does patient have financial barriers related to discharge medications?: No Patient description of barriers related to discharge medications: NA Will patient be returning to same living situation after discharge?: Yes  Summary/Recommendations:   Summary and Recommendations (to be completed by the evaluator): Marie Rice is a 37 yo female wo presented to [facility] due to depression and SI. Pt reports that she has just been feeling down and depressed lately and unmotived. Pt lives with husband and 68 yo daughter and will be returning home with them when discharged. Pt does not have any mental health services and would like help getting setup before discharged. Pt was in dayroom when CSW arrived for assessment, spoke clearly and had good eye contact.While here, Marie Rice, can benefit from crisis stabilization, medication management, therapeutic milieu, and referrals for services.   Izell Radnor. 09/09/2023

## 2023-09-09 NOTE — BHH Group Notes (Signed)

## 2023-09-09 NOTE — BHH Suicide Risk Assessment (Signed)
B[facility] INPATIENT:  Family/Significant Other Suicide Prevention Education  Suicide Prevention Education:  Education Completed; [name] (husband) [phone] been identified by the patient as the family member/significant other with whom the patient will be residing, and identified as the person(s) who will aid the patient in the event of a mental health crisis (suicidal ideations/suicide attempt).  With written consent from the patient, the family member/significant other has been provided the following suicide prevention education, prior to the and/or following the discharge of the patient.  The suicide prevention education provided includes the following: Suicide risk factors Suicide prevention and interventions National Suicide Hotline telephone number [facility] assessment telephone number [facility] Emergency Assistance 911 [facility] and/or Residential Mobile Crisis Unit telephone number  Request made of family/significant other to: Remove weapons (e.g., guns, rifles, knives), all items previously/currently identified as safety concern.   Remove drugs/medications (over-the-counter, prescriptions, illicit drugs), all items previously/currently identified as a safety concern.  The family member/significant other verbalizes understanding of the suicide prevention education information provided.  The family member/significant other agrees to remove the items of safety concern listed above.  Marie Rice 09/09/2023, 3:29 PM

## 2023-09-09 NOTE — Plan of Care (Signed)
  Problem: Education: Goal: Emotional status will improve Outcome: Progressing Goal: Mental status will improve Outcome: Progressing   

## 2023-09-10 DIAGNOSIS — T1491XA Suicide attempt, initial encounter: Secondary | ICD-10-CM | POA: Diagnosis not present

## 2023-09-10 LAB — LIPID PANEL
Cholesterol: 162 mg/dL (ref 0–200)
HDL: 61 mg/dL (ref >40)
LDL Cholesterol: 93 mg/dL (ref 0–99)
Total CHOL/HDL Ratio: 2.7 RATIO
Triglycerides: 40 mg/dL (ref <150)
VLDL: 8 mg/dL (ref 0–40)

## 2023-09-10 LAB — BASIC METABOLIC PANEL
Anion gap: 8 (ref 5–15)
BUN: 10 mg/dL (ref 6–20)
CO2: 25 mmol/L (ref 22–32)
Calcium: 9 mg/dL (ref 8.9–10.3)
Chloride: 105 mmol/L (ref 98–111)
Creatinine, Ser: 0.62 mg/dL (ref 0.44–1.00)
GFR, Estimated: >60 mL/min (ref >60)
Glucose, Bld: 90 mg/dL (ref 70–99)
Potassium: 3.9 mmol/L (ref 3.5–5.1)
Sodium: 138 mmol/L (ref 135–145)

## 2023-09-10 LAB — HEMOGLOBIN A1C
Hgb A1c MFr Bld: 5.1 % (ref 4.8–5.6)
Mean Plasma Glucose: 99.67 mg/dL

## 2023-09-10 MED ORDER — AMANTADINE HCL 50 MG/5ML PO SOLN
50.0000 mg | Freq: Two times a day (BID) | ORAL | Status: DC
  Administered 2023-09-10 – 2023-09-14 (×9): 50 mg via ORAL
  Filled 2023-09-10 (×13): qty 5

## 2023-09-10 NOTE — Group Note (Signed)
LCSW Group Therapy Note   Group Date: 09/10/2023 Start Time: 1100 End Time: 1200   Type of Therapy and Topic: Group Therapy: Relationship Check-Ins   Participation Level: None   Description Group:  In this group patients are encouraged to rate how well or not well they are able to improve their relationships in Beliefs and Values, Communication, Family and Friends, Actuary and Household, and Intimacy. Patients will be able to discuss and identify what is going well within these aspects and what is not. Patients will be able to find appropriate ways, solutions, and skills that will help them within the selected relationships category. Patients will be encouraged to share and reflect on why things within their relationships are not going so well and get feedback from the instructor or their peers.  This group will be solution focused and process-oriented with patients' participation in sharing and listening to their own and peers experience; along with receive support and advice on how to improve or change the circumstance that is known to be challenging in their relationships category (Beliefs and Values, Communication, Family and Friends, Actuary and Household, and Intimacy).   Therapeutic Goals:  Patient will identify their strengths and weakness within their Beliefs and Values, Communication, Family and Friends, Actuary and Household, and Intimacy relationships. Patient will identify reasons why and how they can improve their relationships.  Patient will identify how they can be supportive and honest to themselves and in their relationships.  Patient will be able to gain support and give support to others with similar challenges.   Summary of Patient Progress  Pt was in group but did not participate   Therapeutic Modalities:  Solution Focused Therapy Cognitive Behavioral Therapy  Psychodynamic Therapy  Dialectical Behavior Therapy   Izell [place], LCSW 09/10/2023  12:42 PM

## 2023-09-10 NOTE — Plan of Care (Signed)
  Problem: Education: Goal: Knowledge of [facility] Education information/materials will improve Outcome: Progressing Goal: Emotional status will improve Outcome: Progressing Goal: Mental status will improve Outcome: Progressing   

## 2023-09-10 NOTE — Progress Notes (Signed)
[facility] MD Progress Note  09/10/2023 2:49 PM Marie Rice  MRN:  [id number]  Principal Problem: Suicide attempt [facility]) Diagnosis: Principal Problem:   Suicide attempt [facility])   Reason for Admission:  Marie Rice is a 37 y.o., female with a past psychiatric history of bipolar disorder  who presents to the [facility] Voluntary from the behavioral health urgent care [facility]) for evaluation and management of a recent intentional benadryl overdose and suicidal thoughts.  (admitted on 09/08/2023, total  LOS: 2 days )  Chart Review from last 24 hours:  The patient's chart was reviewed and nursing notes were reviewed. The patient's case was discussed in multidisciplinary team meeting.   - Overnight events to report per chart review / staff report: no notable overnight events to report - Patient received all scheduled medications - Patient did not receive any PRN medications  Information Obtained Today During Patient Interview: The patient was seen and evaluated on the unit. On assessment today the patient reports she feels "out of it," feels very anxious about not getting better, and she brings up her intense regret buying a house because of consequences on their finances. She rates her depression as 10/10, still feels emotionally empty and down. Anxiety as an 8/10 which she feels has not improved since yesterday. She appears a little restless and does not maintain eye contact during most of our conversation. She asks if she can start ADHD medication because she feels restless.  Patient still presents with hypersomnia, anergy, feels guilty about being unable to care for her daughter and herself. Throughout our conversation pt had difficulty concentrating, looking around, and was restless. Endorses moments where she feels like she would be better off not living. Denies increased talkativeness, hyperactivity, grandiosity, sleeplessness. Discussed her goal of attending  more groups and learning various coping skills. Encouraged pt to spend time outside room.  Patient endorses fair sleep; endorses good appetite.  Patient does not endorse any side-effects they attribute to medications. Passively suicidal, no plan or intent.  Denies HI, AVH.   Collateral information Delmar Landau, patient's husband) Patient granted permission to speak to contact person without restrictions.  Spoke with pt's husband to gather collateral information. At baseline, pt is usually a workaholic, happy, affectionate. She was diagnosed with bipolar disorder about 6 years ago. Started Risperdal 4-5 years ago and has worked well. She tried to wean off Risperdal multiple times when she is feeling better, and each time became sick and manic afterwards. He characterized her manic state as being aggressive, hyperactive. Her last manic episode was in 2022, and she had auditory hallucations, delusions after she tried weaning off her Risperdal. Husband adds that pt's relative (uncle's daughter) has a similar psychiatric condition, but he is unsure of her diagnosis.  Husband reports she has been depressed since March and her depression has progressively worsened since then. In April she was hospitalized for 3 weeks for her depression and started on Invega. She was also given a "happy pill" to "balance her out," but because she reportedly became too happy, laughed uncontrollably, hyperactive, they stopped the happy pill and discharged her with the first dose of Invega LAI. She returned the the [place] in and was still depressed, and a local provider started her on Zoloft, which did not improve her symptoms. Pt still had difficulty with basic tasks and responsibilities, has not returned to baseline since March. In July, she received her second dose of Invega LAI at a local clinic.   Husband reports this  is the first time she had taken action on her suicidal thoughts: Over the past few weekends she attempted  to overdose with medications. 1st she mixed a lot of tylenol in a water bottle, 2nd tried to OD with benadryl, 3rd at work trying to take benadryl and another med. Regarding her concern about her finances and regret over paying for a house, husband reports they bought their house and moved in September 2023 and do not have an issue paying their mortgage. When she became sick and could not work, and she was worried by not generating the income to contribute to their payments.  Husband visited yesterday and noticed she is behaving a little more herself (smiling, being humorous) compared to when she was brought to the hospital.    Past Psychiatric History:  - Previous psych diagnoses: Bipolar affective disorder, paranoia - Prior inpatient psychiatric treatment: Reports three previous psychiatric hospitalizations: most recent in [place] around 2 months ago, [place], and in [place].  - Prior outpatient psychiatric treatment: Previously with [facility]. Reports not seeing them anymore because they recommended she get more intensive counseling/therapy than what they provide. - Prior psychiatric provider: [place]  Neuromodulation history: denies   Current therapist: Has outpatient appointment set up for 9/30 to establish care. Psychotherapy hx: Denies   History of suicide attempts: Denies History of homicide: Denies  Psychotropic medications: Current Invega long acting injection - patient reportedly taking consistently, reports adverse effect, namely depression. First dose 03/2023, second dose 06/2023. Next dose scheduled for October   Past Risperdal 1 mg - patient reports good response. Patient reports discontinuing because she became manic and threw away all her medicines. Subsequently, she was started on the Invega LAI that is dosed to be given every three months.  Substance Use History: Alcohol: never drinks Tobacco: never smoked, vaped, or chewed tobacco Cannabis (marijuana): never  tried Cocaine: never tried Methamphetamines: never tried Psilocybin (mushrooms): never tried Ecstasy (MDMA / molly): never tried LSD (acid): never tried Opiates (fentanyl / heroin): never tried Benzos (Xanax, Klonopin):  Denies. UDS  + for benzodiazepine in the ED during this admission IV drug use: denies Prescribed meds abuse: denies History of detox: denies History of rehab: denies  Past Medical History:  Past Medical History:  Diagnosis Date   Asthma    Bipolar 1 disorder (HCC)     Family History:  Medical: Denies Psych: Denies Psych Rx: Denies Suicide: Denies Homicide: Denies Substance use family hx: Denies  Social History:  Place of birth and grew up where: From [place] Abuse: no history of abuse Marital Status: married to husband x 8 years Sexual orientation: straight Children: 4 y.o. daughter Employment: employed as Copywriter, advertising Highest level of education: bachelors degree Housing: living with husband and son in house Finances: Pt worried about finances after she recently bought a house. She is upset it is costly to maintain Legal: no legal issues Military: never served Consulting civil engineer: denies owning any firearms Pills stockpile:  Unknown  Current Medications: Current Facility-Administered Medications  Medication Dose Route Frequency Provider Last Rate Last Admin   acetaminophen (TYLENOL) tablet 650 mg  650 mg Oral Q6H PRN Eligha Bridegroom, NP       alum & mag hydroxide-simeth (MAALOX/MYLANTA) 200-200-20 MG/5ML suspension 30 mL  30 mL Oral Q4H PRN Eligha Bridegroom, NP       amantadine (SYMMETREL) 50 MG/5ML solution 50 mg  50 mg Oral Q12H Massengill, Harrold Donath, MD   50 mg at 09/10/23 1247   buPROPion [facility]  XL) 24 hr tablet 150 mg  150 mg Oral Daily Massengill, Harrold Donath, MD   150 mg at 09/10/23 0834   haloperidol (HALDOL) tablet 5 mg  5 mg Oral TID PRN Eligha Bridegroom, NP       Or   haloperidol lactate (HALDOL) injection 5 mg  5 mg Intramuscular TID PRN Eligha Bridegroom, NP       hydrOXYzine (ATARAX) tablet 25 mg  25 mg Oral TID PRN Eligha Bridegroom, NP   25 mg at 09/08/23 2112   LORazepam (ATIVAN) tablet 2 mg  2 mg Oral TID PRN Eligha Bridegroom, NP       Or   LORazepam (ATIVAN) injection 2 mg  2 mg Intramuscular TID PRN Eligha Bridegroom, NP       magnesium hydroxide (MILK OF MAGNESIA) suspension 30 mL  30 mL Oral Daily PRN Eligha Bridegroom, NP       traZODone (DESYREL) tablet 50 mg  50 mg Oral QHS PRN Eligha Bridegroom, NP   50 mg at 09/08/23 2112    Lab Results:  Results for orders placed or performed during the hospital encounter of 09/08/23 (from the past 48 hour(s))  Basic metabolic panel     Status: None   Collection Time: 09/10/23  6:25 AM  Result Value Ref Range   Sodium 138 135 - 145 mmol/L   Potassium 3.9 3.5 - 5.1 mmol/L   Chloride 105 98 - 111 mmol/L   CO2 25 22 - 32 mmol/L   Glucose, Bld 90 70 - 99 mg/dL    Comment: Glucose reference range applies only to samples taken after fasting for at least 8 hours.   BUN 10 6 - 20 mg/dL   Creatinine, Ser 1.61 0.44 - 1.00 mg/dL   Calcium 9.0 8.9 - 09.6 mg/dL   GFR, Estimated >04 >54 mL/min    Comment: (NOTE) Calculated using the CKD-EPI Creatinine Equation (2021)    Anion gap 8 5 - 15    Comment: Performed at [facility], [address] [place], [place] [address]  Lipid panel     Status: None   Collection Time: 09/10/23  6:25 AM  Result Value Ref Range   Cholesterol 162 0 - 200 mg/dL   Triglycerides 40 <914 mg/dL   HDL 61 >78 mg/dL   Total CHOL/HDL Ratio 2.7 RATIO   VLDL 8 0 - 40 mg/dL   LDL Cholesterol 93 0 - 99 mg/dL    Comment:        Total Cholesterol/HDL:CHD Risk Coronary Heart Disease Risk Table                     Men   Women  1/2 Average Risk   3.4   3.3  Average Risk       5.0   4.4  2 X Average Risk   9.6   7.1  3 X Average Risk  23.4   11.0        Use the calculated Patient Ratio above and the CHD Risk Table to determine the patient's CHD  Risk.        ATP III CLASSIFICATION (LDL):  <100     mg/dL   Optimal  295-621  mg/dL   Near or Above                    Optimal  130-159  mg/dL   Borderline  308-657  mg/dL   High  >846  mg/dL   Very High Performed at [facility], [address] [place], [place] [address]   Hemoglobin A1c     Status: None   Collection Time: 09/10/23  6:25 AM  Result Value Ref Range   Hgb A1c MFr Bld 5.1 4.8 - 5.6 %    Comment: (NOTE) Pre diabetes:          5.7%-6.4%  Diabetes:              >6.4%  Glycemic control for   <7.0% adults with diabetes    Mean Plasma Glucose 99.67 mg/dL    Comment: Performed at [facility], [address] [place], [place] [address]    Blood Alcohol level:  Lab Results  Component Value Date   [facility] <10 09/07/2023    Metabolic Labs: Lab Results  Component Value Date   HGBA1C 5.1 09/10/2023   MPG 99.67 09/10/2023   No results found for: "PROLACTIN" Lab Results  Component Value Date   CHOL 162 09/10/2023   TRIG 40 09/10/2023   HDL 61 09/10/2023   CHOLHDL 2.7 09/10/2023   VLDL 8 09/10/2023   LDLCALC 93 09/10/2023    Physical Findings: AIMS:  Psychiatric Specialty Exam:  General Appearance:  Well-groomed   Eye Contact:  Fair, although easily distracted by other people in hall   Speech:  Decreased rate. Normal rhythm, tone.   Volume:  Decreased   Mood:  Depressed   Affect:  Depressed; Blunted   Thought Content:  Logical   Suicidal Thoughts:  Suicidal Thoughts: Yes, Passive SI Active Intent and/or Plan: Without Intent; Without Plan   Homicidal Thoughts:  Homicidal Thoughts: No   Thought Process:  Linear   Orientation:  Full (Time, Place and Person)     Memory:  Immediate Fair; Recent Fair; Remote Fair   Judgment:  Impaired   Insight:  Lacking   Concentration:  Poor   Recall:  Fair   Fund of Knowledge:  Fair   Language:  Fair   Psychomotor Activity:  Fidgeting with hands   Assets:  Physical Health; Resilience; Social Support   Sleep:  Sleep: Fair    Review of Systems Review of Systems  Constitutional:  Positive for malaise/fatigue. Negative for fever.  Respiratory:  Negative for shortness of breath.   Cardiovascular:  Negative for chest pain.  Neurological:  Negative for dizziness and headaches.    Vital Signs: Blood pressure 108/70, pulse (!) 106, temperature 98.4 F (36.9 C), temperature source Oral, resp. rate 16, height 5\' 4"  (1.626 m), weight 66.7 kg, SpO2 100%. Body mass index is 25.23 kg/m. Physical Exam Vitals reviewed.  Constitutional:      General: She is not in acute distress.    Appearance: She is not ill-appearing or toxic-appearing.  HENT:     Head: Normocephalic and atraumatic.  Pulmonary:     Effort: Pulmonary effort is normal.  Neurological:     General: No focal deficit present.     Mental Status: She is alert.     Assets  Assets: Physical Health; Resilience; Social Support   Treatment Plan Summary: Daily contact with patient to assess and evaluate symptoms and progress in treatment and Medication management  Diagnoses / Active Problems: Suicide attempt [facility]) Principal Problem:   Suicide attempt (HCC)   ASSESSMENT: Bipolar disorder with acute severe depression Substance-induced depression Recent suicide attempt via intentional overdose on Benadryl   PLAN: Safety and Monitoring:             --  Voluntary admission to inpatient psychiatric unit for safety, stabilization and treatment             -- Daily contact with patient to assess and evaluate symptoms and progress in treatment             -- Patient's case to be discussed in multi-disciplinary team meeting             -- Observation Level : q15 minute checks             -- Vital signs: q12 hours             -- Precautions: suicide, elopement, and assault   2. Interventions (medications, psychoeducation, etc):  Pt has a history of bipolar disorder  usually presenting with mania and is currently in a depressive episode. Additionally, she had two recent suicide attempts by intentional drug overdose.  Patient presents with antipsychotic negative symptoms that seem similar to the masked facies seen in Parkinsonism. Consider management with less dopamine blockade.   -- Start amantadine 50 mg             -- Continue Wellbutrin XL 150 mg daily              -- A1c 5.1, lipid panel and BMP WNL             -- f/u with husband for collateral information. According to patient he plans on visiting her this evening and bringing her more clothes.  -- Patient does not need nicotine replacement  PRN medications for symptomatic management:              -- continue acetaminophen 650 mg every 6 hours as needed for mild to moderate pain, fever, and headaches              -- continue hydroxyzine 25 mg three times a day as needed for anxiety              -- continue bismuth subsalicylate 524 mg oral chewable tablet every 3 hours as needed for diarrhea / loose stools              -- continue ondansetron 8 mg every 8 hours as needed for nausea or vomiting              -- continue aluminum-magnesium hydroxide + simethicone 30 mL every 4 hours as needed for heartburn or indigestion              -- continue trazodone 50 mg at bedtime as needed for insomnia  -- As needed agitation protocol in-place  The risks/benefits/side-effects/alternatives to the above medication were discussed in detail with the patient and time was given for questions. The patient consents to medication trial. FDA black box warnings, if present, were discussed.  The patient is agreeable with the medication plan, as above. We will monitor the patient's response to pharmacologic treatment, and adjust medications as necessary.  3. Routine and other pertinent labs:             -- Metabolic profile:  BMI: Body mass index is 25.23 kg/m.  Prolactin: No results found for: "PROLACTIN"  Lipid  Panel: Lab Results  Component Value Date   CHOL 162 09/10/2023   TRIG 40 09/10/2023   HDL 61 09/10/2023   CHOLHDL 2.7 09/10/2023   VLDL 8 09/10/2023   LDLCALC 93 09/10/2023    HbgA1c: Hgb A1c MFr Bld (%)  Date Value  09/10/2023 5.1  TSH: No results found for: "TSH"  EKG monitoring: QTc: 418  4. Group Therapy:  -- Encouraged patient to participate in unit milieu and in scheduled group therapies   -- Short Term Goals: Ability to identify changes in lifestyle to reduce recurrence of condition, verbalize feelings, identify and develop effective coping behaviors, maintain clinical measurements within normal limits, and identify triggers associated with substance abuse/mental health issues will improve. Improvement in ability to disclose and discuss suicidal ideas, demonstrate self-control, and comply with prescribed medications.  -- Long Term Goals: Improvement in symptoms so as ready for discharge -- Patient is encouraged to participate in group therapy while admitted to the psychiatric unit. -- We will address other chronic and acute stressors, which contributed to the patient's Suicide attempt [facility]) in order to reduce the risk of self-harm at discharge.  5. Discharge Planning:   -- Social work and case management to assist with discharge planning and identification of hospital follow-up needs prior to discharge  -- Estimated LOS: 5-7 days  -- Discharge Concerns: Need to establish a safety plan; Medication compliance and effectiveness  -- Discharge Goals: Return home with outpatient referrals for mental health follow-up including medication management/psychotherapy  I certify that inpatient services furnished can reasonably be expected to improve the patient's condition.   Signed: Milbert Coulter, Medical Student 09/10/2023, 2:49 PM

## 2023-09-10 NOTE — Plan of Care (Signed)
  Problem: Activity: Goal: Interest or engagement in activities will improve Outcome: Not Progressing Goal: Sleeping patterns will improve Outcome: Progressing   Problem: Coping: Goal: Ability to verbalize frustrations and anger appropriately will improve Outcome: Progressing  Patient is isolative to room denies SI/HI/A/VH and verbally contracts for safety. Q 15 minutes safety checks ongoing. Patient remains safe.

## 2023-09-10 NOTE — Progress Notes (Addendum)
D. Pt presents with a sad affect/ depressed mood- has been calm and cooperative- rated her depression,hopelessness and anxiety an 8/8/0, respectively.Pt has been visible in the milieu, observed attending group this am. Pt's stated goal today is "to stop thinking about the things I can't control." Pt currently denies SI/HI and AVH and agrees to contact staff before acting on any harmful thoughts.  A. Labs and vitals monitored. Pt given and educated on medications. Pt supported emotionally and encouraged to express concerns and ask questions.   R. Pt remains safe with 15 minute checks. Will continue POC.    09/10/23 1300  Psych Admission Type (Psych Patients Only)  Admission Status Voluntary  Psychosocial Assessment  Patient Complaints Depression;Hopelessness  Eye Contact Fair  Facial Expression Sad  Affect Depressed  Speech Soft  Interaction Cautious  Motor Activity Other (Comment) (steady gait)  Appearance/Hygiene Unremarkable  Behavior Characteristics Cooperative;Calm  Mood Depressed;Anxious  Thought Process  Coherency WDL  Content WDL  Delusions None reported or observed  Perception WDL  Hallucination None reported or observed  Judgment Impaired  Confusion None  Danger to Self  Current suicidal ideation? Denies  Agreement Not to Harm Self Yes  Description of Agreement agreed to contact staff before acting on any harmful thoughts  Danger to Others  Danger to Others None reported or observed

## 2023-09-10 NOTE — [facility] Group Notes (Signed)
Adult Psychoeducational Group Note  Date:  09/10/2023 Time:  9:33 PM  Group Topic/Focus:  Wrap-Up Group:   The focus of this group is to help patients review their daily goal of treatment and discuss progress on daily workbooks.  Participation Level:  Active  Participation Quality:  Appropriate  Affect:  Appropriate  Cognitive:  Appropriate  Insight: Appropriate  Engagement in Group:  Engaged  Modes of Intervention:  Discussion and Support  Additional Comments:  Pt told that today was a good day on the unit, the highlight of which was playing volleyball in the gym. On the subject of ways to stay well upon discharge, Pt mentioned taking her medications prescribed and following up with a therapist. Pt rated her day an 8 out of 10.  Christ Kick 09/10/2023, 9:33 PM

## 2023-09-11 DIAGNOSIS — T1491XA Suicide attempt, initial encounter: Secondary | ICD-10-CM | POA: Diagnosis not present

## 2023-09-11 NOTE — Plan of Care (Signed)
  Problem: Education: Goal: Emotional status will improve Outcome: Progressing Goal: Mental status will improve Outcome: Progressing   Problem: Activity: Goal: Interest or engagement in activities will improve Outcome: Progressing Goal: Sleeping patterns will improve Outcome: Progressing

## 2023-09-11 NOTE — Group Note (Signed)
Date:  09/11/2023 Time:  2:45 PM  Group Topic/Focus:  Wellness Toolbox:   The focus of this group is to discuss various aspects of wellness, balancing those aspects and exploring ways to increase the ability to experience wellness.  Patients will create a wellness toolbox for use upon discharge.    Participation Level:  Active  Participation Quality:  Appropriate  Affect:  Appropriate  Cognitive:  Appropriate  Insight: Appropriate  Engagement in Group:  Engaged  Modes of Intervention:  Socialization  Additional Comments:      Reymundo Poll 09/11/2023, 2:45 PM

## 2023-09-11 NOTE — Group Note (Signed)
Date:  09/11/2023 Time:  11:02 PM  Group Topic/Focus:  Group Therapy; AA Meeting     Participation Level:  Active  Participation Quality:  Appropriate  Affect:  Appropriate  Cognitive:  Appropriate  Insight: Appropriate  Engagement in Group:  Engaged  Modes of Intervention:  Socialization and Support  Additional Comments:  Patient attended AA Meeting/Group.    Kennieth Francois 09/11/2023, 11:02 PM

## 2023-09-11 NOTE — Group Note (Signed)
Date:  09/11/2023 Time:  11:29 AM  Group Topic/Focus:  Goals Group:   The focus of this group is to help patients establish daily goals to achieve during treatment and discuss how the patient can incorporate goal setting into their daily lives to aide in recovery.    Participation Level:  Did Not Attend  Participation Quality:     Affect:      Cognitive:      Insight: None  Engagement in Group:      Modes of Intervention:      Additional Comments:     Reymundo Poll 09/11/2023, 11:29 AM

## 2023-09-11 NOTE — Group Note (Unsigned)
Date:  09/11/2023 Time:  11:40 AM  Group Topic/Focus:  Goals Group:   The focus of this group is to help patients establish daily goals to achieve during treatment and discuss how the patient can incorporate goal setting into their daily lives to aide in recovery.     Participation Level:  {[facility] PARTICIPATION ZOXWR:60454}  Participation Quality:  {[facility] PARTICIPATION QUALITY:22265}  Affect:  {[facility] AFFECT:22266}  Cognitive:  {[facility] COGNITIVE:22267}  Insight: {B[facility] Insight2:20797}  Engagement in Group:  {[facility] ENGAGEMENT IN UJWJX:91478}  Modes of Intervention:  {[facility] MODES OF INTERVENTION:22269}  Additional Comments:  ***  Reymundo Poll 09/11/2023, 11:40 AM

## 2023-09-11 NOTE — Progress Notes (Signed)
[facility] MD Progress Note  09/11/2023 4:46 PM Marie Rice  MRN:  [id number]  Principal Problem: Suicide attempt [facility]) Diagnosis: Principal Problem:   Suicide attempt [facility])   Reason for Admission:  Marie Rice is a 37 y.o., female with a past psychiatric history of bipolar disorder  who presents to the [facility] Voluntary from the behavioral health urgent care [facility]) for evaluation and management of a recent intentional benadryl overdose and suicidal thoughts.  (admitted on 09/08/2023, total  LOS: 3 days )  Chart Review from last 24 hours:  The patient's chart was reviewed and nursing notes were reviewed. The patient's case was discussed in multidisciplinary team meeting.   - Overnight events to report per chart review / staff report: no notable overnight events to report - Patient received all scheduled medications - Patient did not receive any PRN medications  Information Obtained Today During Patient Interview:  Yesterday the psychiatry team made the following recommendations:  -- Start amantadine 50 mg             -- Continue Wellbutrin XL 150 mg daily   On assessment today, the pt reports that their mood goes "between feeling sad and feeling ok." She is visibly expressing more emotions in her face, smiling appropriately, euthymic. She reports feeling emotions again and happy about that. Reports that anxiety is improved. When she feels thoughts of worry regarding her finances, she reminds herself about how happy she and her husband were when they carefully planned buying a house. Still reports some guilt about not caring for herself or her family when she was sick, and she identifies conditions that have triggered her mania in the past, such as not sleeping. Sleep is good quality, pt comments that she has not been sleeping excessively. Appetite is good. Additionally, pt has improved concentration on our conversation, maintains eye contact better  than yesterday. Energy level is improved, pt not hyperactive Denies suicidal thoughts. Denies suicidal intent and plan.  Denies having any HI.  Denies having psychotic symptoms.   Denies having side effects to current psychiatric medications.   We discussed continuing current medication regimen. Patient denies adverse effects they attribute to medication.  Discussed the following psychosocial stressors: finances, work, family, self-hygiene/care   Overall patient reports depression is improving (improved sleep, interest in painting her nails when discharged, less guilt regarding being sick, does not feel the energy burden of leaving bed/room, improved eye contact and concentration, eating full meals, no restless legs).  Denies manic symptoms (patient not distractible, grandiose, overly goal-oriented, flight of ideas, overeating, deficient on sleep,  risky behaviors).   ------------------------------------------- 9/26 Collateral information Delmar Landau, patient's husband) Patient granted permission to speak to contact person without restrictions.  Spoke with pt's husband to gather collateral information. At baseline, pt is usually a workaholic, happy, affectionate. She was diagnosed with bipolar disorder about 6 years ago. Started Risperdal 4-5 years ago and has worked well. She tried to wean off Risperdal multiple times when she is feeling better, and each time became sick and manic afterwards. He characterized her manic state as being aggressive, hyperactive. Her last manic episode was in 2022, and she had auditory hallucations, delusions after she tried weaning off her Risperdal. Husband adds that pt's relative (uncle's daughter) has a similar psychiatric condition, but he is unsure of her diagnosis.  Husband reports she has been depressed since March and her depression has progressively worsened since then. In April she was hospitalized for 3 weeks for her depression  and started on  Invega. She was also given a "happy pill" to "balance her out," but because she reportedly became too happy, laughed uncontrollably, hyperactive, they stopped the happy pill and discharged her with the first dose of Invega LAI. She returned the the [place] in and was still depressed, and a local provider started her on Zoloft, which did not improve her symptoms. Pt still had difficulty with basic tasks and responsibilities, has not returned to baseline since March. In July, she received her second dose of Invega LAI at a local clinic.   Husband reports this is the first time she had taken action on her suicidal thoughts: Over the past few weekends she attempted to overdose with medications. 1st she mixed a lot of tylenol in a water bottle, 2nd tried to OD with benadryl, 3rd at work trying to take benadryl and another med. Regarding her concern about her finances and regret over paying for a house, husband reports they bought their house and moved in September 2023 and do not have an issue paying their mortgage. When she became sick and could not work, and she was worried by not generating the income to contribute to their payments.  Husband visited yesterday and noticed she is behaving a little more herself (smiling, being humorous) compared to when she was brought to the hospital.    Past Psychiatric History:  - Previous psych diagnoses: Bipolar affective disorder, paranoia - Prior inpatient psychiatric treatment: Reports three previous psychiatric hospitalizations: most recent in [place] around 2 months ago, [place], and in [place].  - Prior outpatient psychiatric treatment: Previously with [facility]. Reports not seeing them anymore because they recommended she get more intensive counseling/therapy than what they provide. - Prior psychiatric provider: [place]  Neuromodulation history: denies   Current therapist: Has outpatient appointment set up for 9/30 to establish care. Psychotherapy hx:  Denies   History of suicide attempts: Denies History of homicide: Denies  Psychotropic medications: Recent Invega long acting injection - patient reportedly taking consistently, reports adverse effect, namely depression. First dose 03/2023, second dose 06/2023. Next dose scheduled for October   Past Risperdal 1 mg - patient reports good response. Patient reports discontinuing because she became manic and threw away all her medicines. Subsequently, she was started on the Invega LAI that is dosed to be given every three months.  Substance Use History: Alcohol: never drinks Tobacco: never smoked, vaped, or chewed tobacco Cannabis (marijuana): never tried Cocaine: never tried Methamphetamines: never tried Psilocybin (mushrooms): never tried Ecstasy (MDMA / molly): never tried LSD (acid): never tried Opiates (fentanyl / heroin): never tried Benzos (Xanax, Klonopin):  Denies. UDS  + for benzodiazepine in the ED during this admission IV drug use: denies Prescribed meds abuse: denies History of detox: denies History of rehab: denies  Past Medical History:  Past Medical History:  Diagnosis Date   Asthma    Bipolar 1 disorder (HCC)     Family History:  Medical: Denies Psych: Denies Psych Rx: Denies Suicide: Denies Homicide: Denies Substance use family hx: Denies  Social History:  Place of birth and grew up where: From [place] Abuse: no history of abuse Marital Status: married to husband x 8 years Sexual orientation: straight Children: 4 y.o. daughter Employment: employed as Copywriter, advertising Highest level of education: bachelors degree Housing: living with husband and son in house Finances: Pt worried about finances after she recently bought a house. She is upset it is costly to maintain Legal: no legal issues Military: never  served Consulting civil engineer: denies owning any firearms Pills stockpile:  Unknown  Current Medications: Current Facility-Administered Medications  Medication  Dose Route Frequency Provider Last Rate Last Admin   acetaminophen (TYLENOL) tablet 650 mg  650 mg Oral Q6H PRN Eligha Bridegroom, NP       alum & mag hydroxide-simeth (MAALOX/MYLANTA) 200-200-20 MG/5ML suspension 30 mL  30 mL Oral Q4H PRN Eligha Bridegroom, NP       amantadine (SYMMETREL) 50 MG/5ML solution 50 mg  50 mg Oral Q12H Massengill, Harrold Donath, MD   50 mg at 09/11/23 1610   buPROPion (WELLBUTRIN XL) 24 hr tablet 150 mg  150 mg Oral Daily Massengill, Harrold Donath, MD   150 mg at 09/11/23 9604   haloperidol (HALDOL) tablet 5 mg  5 mg Oral TID PRN Eligha Bridegroom, NP       Or   haloperidol lactate (HALDOL) injection 5 mg  5 mg Intramuscular TID PRN Eligha Bridegroom, NP       hydrOXYzine (ATARAX) tablet 25 mg  25 mg Oral TID PRN Eligha Bridegroom, NP   25 mg at 09/08/23 2112   LORazepam (ATIVAN) tablet 2 mg  2 mg Oral TID PRN Eligha Bridegroom, NP       Or   LORazepam (ATIVAN) injection 2 mg  2 mg Intramuscular TID PRN Eligha Bridegroom, NP       magnesium hydroxide (MILK OF MAGNESIA) suspension 30 mL  30 mL Oral Daily PRN Eligha Bridegroom, NP       traZODone (DESYREL) tablet 50 mg  50 mg Oral QHS PRN Eligha Bridegroom, NP   50 mg at 09/08/23 2112    Lab Results:  Results for orders placed or performed during the hospital encounter of 09/08/23 (from the past 48 hour(s))  Basic metabolic panel     Status: None   Collection Time: 09/10/23  6:25 AM  Result Value Ref Range   Sodium 138 135 - 145 mmol/L   Potassium 3.9 3.5 - 5.1 mmol/L   Chloride 105 98 - 111 mmol/L   CO2 25 22 - 32 mmol/L   Glucose, Bld 90 70 - 99 mg/dL    Comment: Glucose reference range applies only to samples taken after fasting for at least 8 hours.   BUN 10 6 - 20 mg/dL   Creatinine, Ser 5.40 0.44 - 1.00 mg/dL   Calcium 9.0 8.9 - 98.1 mg/dL   GFR, Estimated >19 >14 mL/min    Comment: (NOTE) Calculated using the CKD-EPI Creatinine Equation (2021)    Anion gap 8 5 - 15    Comment: Performed at [facility],  [address] [place], [place] [address]  Lipid panel     Status: None   Collection Time: 09/10/23  6:25 AM  Result Value Ref Range   Cholesterol 162 0 - 200 mg/dL   Triglycerides 40 <621 mg/dL   HDL 61 >30 mg/dL   Total CHOL/HDL Ratio 2.7 RATIO   VLDL 8 0 - 40 mg/dL   LDL Cholesterol 93 0 - 99 mg/dL    Comment:        Total Cholesterol/HDL:CHD Risk Coronary Heart Disease Risk Table                     Men   Women  1/2 Average Risk   3.4   3.3  Average Risk       5.0   4.4  2 X Average Risk   9.6   7.1  3 X  Average Risk  23.4   11.0        Use the calculated Patient Ratio above and the CHD Risk Table to determine the patient's CHD Risk.        ATP III CLASSIFICATION (LDL):  <100     mg/dL   Optimal  161-096  mg/dL   Near or Above                    Optimal  130-159  mg/dL   Borderline  045-409  mg/dL   High  >811     mg/dL   Very High Performed at [facility], [address] [place], [place] [address]   Hemoglobin A1c     Status: None   Collection Time: 09/10/23  6:25 AM  Result Value Ref Range   Hgb A1c MFr Bld 5.1 4.8 - 5.6 %    Comment: (NOTE) Pre diabetes:          5.7%-6.4%  Diabetes:              >6.4%  Glycemic control for   <7.0% adults with diabetes    Mean Plasma Glucose 99.67 mg/dL    Comment: Performed at [facility], [address] [place], [place] [address]    Blood Alcohol level:  Lab Results  Component Value Date   ETH <10 09/07/2023    Metabolic Labs: Lab Results  Component Value Date   HGBA1C 5.1 09/10/2023   MPG 99.67 09/10/2023   No results found for: "PROLACTIN" Lab Results  Component Value Date   CHOL 162 09/10/2023   TRIG 40 09/10/2023   HDL 61 09/10/2023   CHOLHDL 2.7 09/10/2023   VLDL 8 09/10/2023   LDLCALC 93 09/10/2023    Physical Findings:  Psychiatric Specialty Exam:  General Appearance:  Well-groomed   Eye Contact:  Good   Speech:  Normal rate, rhythm, tone   Volume:   Normal  Mood:  "Sometimes sad but also okay and happy"   Affect:  Congruent, euthymic, bright   Thought Content:  Logical   Suicidal Thoughts:  Denies thoughts, plan, intent   Homicidal Thoughts:  Denies   Thought Process:  Linear, forward-thinking   Orientation:  Full (Time, Place and Person)     Memory:  Immediate Fair; Recent Fair; Remote Fair   Judgment:  Fair - identifies coping skills and scenarios in which they will help her during stressful moments and decrease liklihood of triggering manic episodes   Insight:  Fair - Notices she can feel and express more of her emotions and that she is less depressed but not hypomanic/manic. She acknowledges she used to feel depressed and not motivated. Now she has spent past two days taking action to do activities/self-care that she needs to do regardless of motivation and that is helping her recover   Concentration:  Fair, improving   Recall:  [employer] of Knowledge:  Fair   Language:  Fair   Psychomotor Activity:  None  Assets:  Physical Health; Resilience; Social Support   Sleep:  Good, reports full night of sleep    Review of Systems Review of Systems  Constitutional:  Negative for fever and malaise/fatigue.  Respiratory:  Negative for shortness of breath.   Cardiovascular:  Negative for chest pain.  Gastrointestinal:  Negative for abdominal pain, diarrhea and nausea.  Genitourinary: Negative.   Neurological:  Negative for dizziness, weakness and headaches.    Vital Signs: Blood  pressure 107/61, pulse 98, temperature 98.2 F (36.8 C), temperature source Oral, resp. rate 16, height 5\' 4"  (1.626 m), weight 66.7 kg, SpO2 100%. Body mass index is 25.23 kg/m. Physical Exam Vitals reviewed.  Constitutional:      General: She is not in acute distress.    Appearance: She is not ill-appearing or toxic-appearing.  HENT:     Head: Normocephalic and atraumatic.  Pulmonary:     Effort: Pulmonary effort is  normal.  Neurological:     General: No focal deficit present.     Mental Status: She is alert.     Assets  Assets: Physical Health; Resilience; Social Support   Treatment Plan Summary: Daily contact with patient to assess and evaluate symptoms and progress in treatment and Medication management  Diagnoses / Active Problems: Suicide attempt [facility]) Principal Problem:   Suicide attempt (HCC)   ASSESSMENT: Bipolar disorder with acute severe depression Substance-induced depression Recent suicide attempt via intentional overdose on Benadryl   PLAN: Safety and Monitoring:             -- Voluntary admission to inpatient psychiatric unit for safety, stabilization and treatment             -- Daily contact with patient to assess and evaluate symptoms and progress in treatment             -- Patient's case to be discussed in multi-disciplinary team meeting             -- Observation Level : q15 minute checks             -- Vital signs: q12 hours             -- Precautions: suicide, elopement, and assault   2. Interventions (medications, psychoeducation, etc):  Pt has a history of bipolar disorder usually presenting with mania and presented to [facility] in a depressive episode. Additionally, she had two recent suicide attempts by intentional drug overdose.  Patient presented with flat affect and antipsychotic negative symptoms similar to the masked facies seen in Parkinsonism, which have resolved.  Consider management with less dopamine blockade. However, patient recently started on amantadine and bupropion, and will need additional monitoring for potential development of manic symptoms in the setting of increased dopamine production.   -- Continue amantadine 50 mg             -- Continue Wellbutrin XL 150 mg daily              -- A1c 5.1, lipid panel and BMP WNL             -- Patient's husband reported pt behaving more like herself and is more upbeat, bright, humorous  -- Patient does not  need nicotine replacement  PRN medications for symptomatic management:              -- continue acetaminophen 650 mg every 6 hours as needed for mild to moderate pain, fever, and headaches              -- continue hydroxyzine 25 mg three times a day as needed for anxiety              -- continue bismuth subsalicylate 524 mg oral chewable tablet every 3 hours as needed for diarrhea / loose stools              -- continue ondansetron 8 mg every 8 hours as needed for nausea or vomiting              --  continue aluminum-magnesium hydroxide + simethicone 30 mL every 4 hours as needed for heartburn or indigestion              -- continue trazodone 50 mg at bedtime as needed for insomnia  -- As needed agitation protocol in-place  The risks/benefits/side-effects/alternatives to the above medication were discussed in detail with the patient and time was given for questions. The patient consents to medication trial. FDA black box warnings, if present, were discussed.  The patient is agreeable with the medication plan, as above. We will monitor the patient's response to pharmacologic treatment, and adjust medications as necessary.  3. Routine and other pertinent labs:             -- Metabolic profile:  BMI: Body mass index is 25.23 kg/m.  Prolactin: No results found for: "PROLACTIN"  Lipid Panel: Lab Results  Component Value Date   CHOL 162 09/10/2023   TRIG 40 09/10/2023   HDL 61 09/10/2023   CHOLHDL 2.7 09/10/2023   VLDL 8 09/10/2023   LDLCALC 93 09/10/2023    HbgA1c: Hgb A1c MFr Bld (%)  Date Value  09/10/2023 5.1    TSH: No results found for: "TSH"  EKG monitoring: QTc: 418  4. Group Therapy:  -- Encouraged patient to participate in unit milieu and in scheduled group therapies   -- Short Term Goals: Ability to identify changes in lifestyle to reduce recurrence of condition, verbalize feelings, identify and develop effective coping behaviors, maintain clinical measurements  within normal limits, and identify triggers associated with substance abuse/mental health issues will improve. Improvement in ability to disclose and discuss suicidal ideas, demonstrate self-control, and comply with prescribed medications.  -- Long Term Goals: Improvement in symptoms so as ready for discharge -- Patient is encouraged to participate in group therapy while admitted to the psychiatric unit. -- We will address other chronic and acute stressors, which contributed to the patient's Suicide attempt [facility]) in order to reduce the risk of self-harm at discharge.  5. Discharge Planning:   -- Social work and case management to assist with discharge planning and identification of hospital follow-up needs prior to discharge  -- Estimated LOS: 5-7 days. Consider discharge on Monday  -- Discharge Concerns: Need to establish a safety plan; Medication compliance and effectiveness  -- Discharge Goals: Return home with outpatient referrals for mental health follow-up including medication management/psychotherapy  I certify that inpatient services furnished can reasonably be expected to improve the patient's condition.   Signed: Milbert Coulter, Medical Student 09/11/2023, 4:46 PM

## 2023-09-11 NOTE — Progress Notes (Addendum)
Pt denied SI/HI/AVH this morning. Pt rated her depression a 4/10, anxiety a 0/10, and feelings of hopelessness a 5/10. Pt reports that she slept "good" last night. Pt reports that her goal for today is "Accepting the things that I cannot control". Pt has been pleasant, calm, and cooperative throughout the shift. RN provided support and encouragement to patient. Pt given scheduled medications as prescribed. Q15 min checks verified for safety. Patient verbally contracts for safety. Patient compliant with medications and treatment plan. Patient is interacting well on the unit. Pt is safe on the unit.   09/11/23 0800  Psych Admission Type (Psych Patients Only)  Admission Status Voluntary  Psychosocial Assessment  Patient Complaints None  Eye Contact Brief  Facial Expression Anxious  Affect Anxious;Depressed  Speech Soft  Interaction Cautious  Motor Activity Other (Comment) (WDL)  Appearance/Hygiene Unremarkable  Behavior Characteristics Cooperative;Appropriate to situation  Mood Depressed;Anxious;Sad  Thought Process  Coherency WDL  Content WDL  Delusions None reported or observed  Perception WDL  Hallucination None reported or observed  Judgment Impaired  Confusion None  Danger to Self  Current suicidal ideation? Denies  Agreement Not to Harm Self Yes  Description of Agreement Verbal  Danger to Others  Danger to Others None reported or observed

## 2023-09-11 NOTE — Progress Notes (Signed)
   09/10/23 2000  Psych Admission Type (Psych Patients Only)  Admission Status Voluntary  Psychosocial Assessment  Patient Complaints Depression;Anxiety  Eye Contact Brief  Facial Expression Anxious  Affect Depressed  Speech Soft;Slow  Interaction Cautious  Motor Activity Other (Comment)  Appearance/Hygiene Unremarkable  Behavior Characteristics Cooperative;Appropriate to situation  Mood Depressed  Thought Process  Coherency WDL  Content WDL  Delusions None reported or observed  Perception WDL  Hallucination None reported or observed  Judgment Impaired  Confusion None  Danger to Self  Current suicidal ideation? Denies (Denies)  Agreement Not to Harm Self Yes  Description of Agreement verbal  Danger to Others  Danger to Others None reported or observed

## 2023-09-11 NOTE — Group Note (Signed)
Recreation Therapy Group Note   Group Topic:Leisure Education  Group Date: 09/11/2023 Start Time: 0930 End Time: 1000 Facilitators: Eleno Weimar-McCall, LRT,CTRS Location: [address] Dayroom   Group Topic: Leisure Education   Goal Area(s) Addresses:  Patient will successfully demonstrate knowledge of leisure and recreation interests. Patient will successfully identify benefits of leisure participation.  Patient will verbalize appropriate recreation activities to use post discharge.  Intervention: Music Trivia   Group Description: LRT facilitated a competitive group game to challenge patients knowledge of music. In teams, patients were asked to answer trivia questions that required patients to finish the lyric or guess the artist. The team with the most points at the end of the game won.   Education:  Leisure Programme researcher, broadcasting/film/video, Publishing copy Outcome: Acknowledges education   Affect/Mood: Appropriate   Participation Level: Minimal   Participation Quality: Independent   Behavior: Off-task   Speech/Thought Process: Preoccupied   Insight: Moderate   Judgement: Moderate   Modes of Intervention: Competitive Play   Patient Response to Interventions:  Disengaged   Education Outcome:  In group clarification offered    Clinical Observations/Individualized Feedback: Pt attended group. Pt was playing with the Jenga blocks during group session. Pt answered one question during activity.    Plan: Continue to engage patient in RT group sessions 2-3x/week.   Vanessa Alesi-McCall, LRT,CTRS 09/11/2023 12:23 PM

## 2023-09-11 NOTE — Group Note (Signed)
Date:  09/11/2023 Time:  1:32 PM  Group Topic/Focus:  Wellness Toolbox:   The focus of this group is to discuss various aspects of wellness, balancing those aspects and exploring ways to increase the ability to experience wellness.  Patients will create a wellness toolbox for use upon discharge.    Participation Level:  Did Not Attend  Participation Quality:      Affect:      Cognitive:      Insight: None  Engagement in Group:      Modes of Intervention:      Additional Comments:     Reymundo Poll 09/11/2023, 1:32 PM

## 2023-09-11 NOTE — Group Note (Deleted)
Date:  09/11/2023 Time:  9:33 PM  Group Topic/Focus:  AA Meeting/Group     Participation Level:  {BHH PARTICIPATION EXBMW:41324}  Participation Quality:  {BHH PARTICIPATION QUALITY:22265}  Affect:  {BHH AFFECT:22266}  Cognitive:  {BHH COGNITIVE:22267}  Insight: {BHH Insight2:20797}  Engagement in Group:  {BHH ENGAGEMENT IN MWNUU:72536}  Modes of Intervention:  {BHH MODES OF INTERVENTION:22269}  Additional Comments:  ***  Marie Rice 09/11/2023, 9:33 PM

## 2023-09-11 NOTE — Plan of Care (Signed)
?  Problem: Education: ?Goal: Mental status will improve ?Outcome: Progressing ?Goal: Verbalization of understanding the information provided will improve ?Outcome: Progressing ?  ?

## 2023-09-12 MED ORDER — TRAZODONE HCL 50 MG PO TABS
50.0000 mg | ORAL_TABLET | Freq: Every evening | ORAL | Status: DC | PRN
  Administered 2023-09-12: 50 mg via ORAL
  Filled 2023-09-12 (×8): qty 1

## 2023-09-12 NOTE — Progress Notes (Signed)
   09/12/23 2313  Psych Admission Type (Psych Patients Only)  Admission Status Voluntary  Psychosocial Assessment  Patient Complaints Insomnia;Sleep disturbance  Eye Contact Fair  Facial Expression Anxious  Affect Appropriate to circumstance  Speech Logical/coherent  Interaction Assertive  Motor Activity Other (Comment) (WDL)  Appearance/Hygiene Unremarkable  Behavior Characteristics Appropriate to situation  Mood Pleasant;Anxious  Thought Process  Coherency WDL  Content WDL  Delusions None reported or observed  Perception WDL  Hallucination None reported or observed  Judgment Poor  Confusion None  Danger to Self  Current suicidal ideation? Denies  Agreement Not to Harm Self Yes  Description of Agreement verbal  Danger to Others  Danger to Others None reported or observed

## 2023-09-12 NOTE — Group Note (Signed)
LCSW Group Therapy Note   Group Date: 09/12/2023 Start Time: 1000 End Time: 1100   Type of Therapy and Topic:  Group Therapy: Achieving Balance  Participation Level:  Active  Description of Group:  This group will help everyone identify area of their lives where they desire more balance. What they desire less of in their lives and identify areas where they need to simplify and or intensify responsibilities. Acknowledging barriers that may interfere with them making changes. Also, what do they need they need to give up to achieve the balanced life that they want.   A lack of balance in work and life is not all uncommon! This exercise can help you understand how to foster work and life balance. It specifically helps you become aware of the areas that do need balance as well as the ones that prevent you from taking action.   Therapeutic Goals: To develop individualized plans to help improve the strength, mental agility and action step to attain balance in their lives.  To become more aware and encourage clients to look at their situation, identify any stressors, and formulate a plan to prioritize.     Summary of Patient Progress:   During group the patient contributed to group, and shared the desire to have more balance work life and personal life. Pt shared that less negative thoughts about things out of their control to include simplifying activity and responsibilities and responsibilities. Pt shared that that to become more balanced in life they need to manage emotions better, less thinking about things out of their control and develop coping skills. A current barrier that prevents them from achieving balance is exercising.  To achieve balance that she desires she shared that giving up negative thoughts about things they can't control    Therapeutic Modalities:  Task Centered  Strength Based Approach  Steffanie Dunn, LCSWA 09/12/2023  2:28 PM

## 2023-09-12 NOTE — Progress Notes (Signed)
D. Pt has been calm and cooperative- visible in the milieu, observed attending group and interacting appropriately with peers. Pt reported that she didn't sleep well last night- did not fall asleep until 3 am despite having received 50 mg of Trazadone. Per pt's self inventory, pt rated her depression,hopelessness and anxiety a 4/5/0, respectively. Pt's stated goal today is "to stop thinking about the things I cannot control." Pt currently denies SI/HI and AVH  A. Labs and vitals monitored. Pt given and educated on medications. Pt supported emotionally and encouraged to express concerns and ask questions.   R. Pt remains safe with 15 minute checks. Will continue POC.

## 2023-09-12 NOTE — Progress Notes (Addendum)
na

## 2023-09-12 NOTE — Progress Notes (Addendum)
[facility] MD Progress Note  09/12/2023 3:35 PM Marie Rice  MRN:  [id number] Subjective:  Marie Rice is a 37 y.o., female with a past psychiatric history of bipolar disorder  who presents to the [facility] Voluntary from the behavioral health urgent care [facility]) for evaluation and management of a recent intentional benadryl overdose and suicidal thoughts.  (admitted on 09/08/2023, total  LOS: 3 days )   Chart Review from last 24 hours:  The patient's chart was reviewed and nursing notes were reviewed. The patient's case was discussed in multidisciplinary team meeting.    - Overnight events to report per chart review / staff report: no notable overnight events to report - Patient received all scheduled medications - Patient received trazodone 50mg  at bedtime prn for sleep   Information Obtained Today During Patient Interview:   Yesterday the psychiatry team made the following recommendations:             -- Continue amantadine 50 mg bid             -- Continue Wellbutrin XL 150 mg daily   On assessment today patient reports that she did not sleep well and tried trazodone. Patient reports that she still did not sleep well, and reports her issue was falling asleep. Patient reports that she thinks she may have gotten like 3hr of sleep to nursing staff. Otherwise, patient endorses some improvement in mood reporting that she is feeling "ok." Patient reports she has some low levels of general anxiety but denies that she is worried about mortgage payments and reports that she really has not thought about this today. Patient reports that she has been having good conversations with her husband and spoke with him last night. Patient reports that her daughter is a reason to live and denies both passive and active SI. Patient denies HI and AVH. Patient also denies any other symptoms of paranoia and delusions. Patient reports that she feels like her thoughts are moving at an  appropriate pace and does not fel that they are blocked, but she does feel that her emotions are being blunted.   Discussed with patient the need to also get a prolactin level given her symptoms. Patient was in agreement. Patient was able to communicate that she thought her las blood draw could be used, but once notified that this would not work, she was ok with the additional lab draw. Patient reports she is eating well and had a BM yesterday. Patient denies an n/v.    Principal Problem: Suicide attempt [facility]) Diagnosis: Principal Problem:   Suicide attempt (HCC)  Total Time spent with patient: 20 minutes  Past Psychiatric History: - Previous psych diagnoses: Bipolar affective disorder, paranoia - Prior inpatient psychiatric treatment: Reports three previous psychiatric hospitalizations: most recent in [place] around 2 months ago, [place], and in [place].  - Prior outpatient psychiatric treatment: Previously with [facility]. Reports not seeing them anymore because they recommended she get more intensive counseling/therapy than what they provide. - Prior psychiatric provider: [place]   Neuromodulation history: denies   Current therapist: Has outpatient appointment set up for 9/30 to establish care. Psychotherapy hx: Denies   History of suicide attempts: Denies History of homicide: Denies Psychotropic medications: Recent Invega long acting injection - patient reportedly taking consistently, reports adverse effect, namely depression. First dose 03/2023, second dose 06/2023. Next dose scheduled for October   Past Risperdal 1 mg - patient reports good response. Patient reports discontinuing because she became manic and  threw away all her medicines. Subsequently, she was started on the Invega LAI that is dosed to be given every three months.   Past Medical History:  Past Medical History:  Diagnosis Date   Asthma    Bipolar 1 disorder (HCC)     Past Surgical History:  Procedure  Laterality Date   CESAREAN SECTION     Family History: History reviewed. No pertinent family history. Family Psychiatric  History: see h&p Social History:  Social History   Substance and Sexual Activity  Alcohol Use Not Currently     Social History   Substance and Sexual Activity  Drug Use Not Currently    Social History   Socioeconomic History   Marital status: Married    Spouse name: Not on file   Number of children: Not on file   Years of education: Not on file   Highest education level: Not on file  Occupational History   Not on file  Tobacco Use   Smoking status: Never   Smokeless tobacco: Never  Vaping Use   Vaping status: Unknown  Substance and Sexual Activity   Alcohol use: Not Currently   Drug use: Not Currently   Sexual activity: Yes  Other Topics Concern   Not on file  Social History Narrative   Not on file   Social Determinants of Health   Financial Resource Strain: Low Risk  (08/25/2023)   Received from [facility]   Overall Financial Resource Strain (CARDIA)    Difficulty of Paying Living Expenses: Not very hard  Food Insecurity: Unknown (09/08/2023)   Hunger Vital Sign    Worried About Running Out of Food in the Last Year: Not on file    Ran Out of Food in the Last Year: Never true  Transportation Needs: No Transportation Needs (09/08/2023)   PRAPARE - Administrator, Civil Service (Medical): No    Lack of Transportation (Non-Medical): No  Physical Activity: Unknown (08/25/2023)   Received from [facility]   Exercise Vital Sign    Days of Exercise per Week: 0 days    Minutes of Exercise per Session: Not on file  Stress: Stress Concern Present (08/25/2023)   Received from [facility] of Occupational Health - Occupational Stress Questionnaire    Feeling of Stress : Rather much  Social Connections: Socially Integrated (08/25/2023)   Received from [facility]   Social Network    How would you rate your  social network (family, work, friends)?: Good participation with social networks   Additional Social History:                         Sleep: Poor  Appetite:  Good  Current Medications: Current Facility-Administered Medications  Medication Dose Route Frequency Provider Last Rate Last Admin   acetaminophen (TYLENOL) tablet 650 mg  650 mg Oral Q6H PRN Eligha Bridegroom, NP       alum & mag hydroxide-simeth (MAALOX/MYLANTA) 200-200-20 MG/5ML suspension 30 mL  30 mL Oral Q4H PRN Eligha Bridegroom, NP       amantadine (SYMMETREL) 50 MG/5ML solution 50 mg  50 mg Oral Q12H Massengill, Nathan, MD   50 mg at 09/12/23 0744   buPROPion (WELLBUTRIN XL) 24 hr tablet 150 mg  150 mg Oral Daily Massengill, Harrold Donath, MD   150 mg at 09/12/23 0744   haloperidol (HALDOL) tablet 5 mg  5 mg Oral TID PRN Eligha Bridegroom, NP  Or   haloperidol lactate (HALDOL) injection 5 mg  5 mg Intramuscular TID PRN Eligha Bridegroom, NP       hydrOXYzine (ATARAX) tablet 25 mg  25 mg Oral TID PRN Eligha Bridegroom, NP   25 mg at 09/08/23 2112   LORazepam (ATIVAN) tablet 2 mg  2 mg Oral TID PRN Eligha Bridegroom, NP       Or   LORazepam (ATIVAN) injection 2 mg  2 mg Intramuscular TID PRN Eligha Bridegroom, NP       magnesium hydroxide (MILK OF MAGNESIA) suspension 30 mL  30 mL Oral Daily PRN Eligha Bridegroom, NP       traZODone (DESYREL) tablet 50 mg  50 mg Oral QHS,MR X 1 Sergio Hobart B, MD        Lab Results: No results found for this or any previous visit (from the past 48 hour(s)).  Blood Alcohol level:  Lab Results  Component Value Date   ETH <10 09/07/2023    Metabolic Disorder Labs: Lab Results  Component Value Date   HGBA1C 5.1 09/10/2023   MPG 99.67 09/10/2023   No results found for: "PROLACTIN" Lab Results  Component Value Date   CHOL 162 09/10/2023   TRIG 40 09/10/2023   HDL 61 09/10/2023   CHOLHDL 2.7 09/10/2023   VLDL 8 09/10/2023   LDLCALC 93 09/10/2023    Physical Findings: AIMS:  Facial and Oral Movements Muscles of Facial Expression: None, normal Lips and Perioral Area: None, normal Jaw: None, normal Tongue: None, normal,Extremity Movements Upper (arms, wrists, hands, fingers): None, normal Lower (legs, knees, ankles, toes): None, normal, Trunk Movements Neck, shoulders, hips: None, normal, Overall Severity Severity of abnormal movements (highest score from questions above): None, normal Incapacitation due to abnormal movements: None, normal Patient's awareness of abnormal movements (rate only patient's report): No Awareness, Dental Status Current problems with teeth and/or dentures?: No Does patient usually wear dentures?: No  CIWA:    COWS:     Musculoskeletal: Strength & Muscle Tone: within normal limits, no rigidity noted and arms swing appears appropriate when walking Gait & Station: normal Patient leans: N/A  Psychiatric Specialty Exam:  Presentation  General Appearance:  Casual (hair is disheveled)  Eye Contact: Good  Speech: Clear and Coherent; Slow  Speech Volume: Decreased  Handedness: Right   Mood and Affect  Mood: Euthymic  Affect: Flat   Thought Process  Thought Processes: Goal Directed  Descriptions of Associations:Intact  Orientation:Full (Time, Place and Person)  Thought Content:Logical  History of Schizophrenia/Schizoaffective disorder:No  Duration of Psychotic Symptoms:No data recorded Hallucinations:Hallucinations: None  Ideas of Reference:None  Suicidal Thoughts:Suicidal Thoughts: No  Homicidal Thoughts:Homicidal Thoughts: No   Sensorium  Memory: Immediate Fair; Recent Fair  Judgment: Fair  Insight: Shallow   Executive Functions  Concentration: Fair  Attention Span: Fair  Recall: [employer] of Knowledge: Fair  Language: Fair   Psychomotor Activity  Psychomotor Activity: Psychomotor Activity: Decreased   Assets  Assets: Communication Skills; Desire for Improvement;  Housing; Intimacy; Leisure Time; Social Support; Resilience   Sleep  Sleep: Sleep: Poor Number of Hours of Sleep: 8.25 (patient reports she was really just lying bed most the night)    Physical Exam: Physical Exam HENT:     Head: Normocephalic and atraumatic.  Pulmonary:     Effort: Pulmonary effort is normal.  Neurological:     Mental Status: She is alert and oriented to person, place, and time.    Review of Systems  Psychiatric/Behavioral:  Negative for hallucinations and suicidal ideas. The patient has insomnia.    Blood pressure 108/70, pulse (!) 101, temperature 98.4 F (36.9 C), temperature source Oral, resp. rate 16, height 5\' 4"  (1.626 m), weight 66.7 kg, SpO2 100%. Body mass index is 25.23 kg/m.   Treatment Plan Summary: Daily contact with patient to assess and evaluate symptoms and progress in treatment and Medication management ASSESSMENT: Bipolar disorder with acute severe depression Substance-induced depression Recent suicide attempt via intentional overdose on Benadryl     PLAN: Safety and Monitoring:             -- Voluntary admission to inpatient psychiatric unit for safety, stabilization and treatment             -- Daily contact with patient to assess and evaluate symptoms and progress in treatment             -- Patient's case to be discussed in multi-disciplinary team meeting             -- Observation Level : q15 minute checks             -- Vital signs: q12 hours             -- Precautions: suicide, elopement, and assault   2. Interventions (medications, psychoeducation, etc):  Pt has a history of bipolar disorder usually presenting with mania and presented to [facility] in a depressive episode. Additionally, she had two recent suicide attempts by intentional drug overdose.  Patient presented with flat affect and antipsychotic negative symptoms similar to the masked facies seen in Parkinsonism, which have resolved.  Consider management with less dopamine  blockade. However, patient recently started on amantadine and bupropion, and will need additional monitoring for potential development of manic symptoms in the setting of increased dopamine production. Will also obtain prolactin level given concern for sensitivity to dopamine blockade.               -- Continue amantadine 50 mg bid             -- Continue Wellbutrin XL 150 mg daily              -- A1c 5.1, lipid panel and BMP WNL             -- Patient's husband reported pt behaving more like herself and is more upbeat, bright, humorous, 9/26             -- Patient does not need nicotine replacement   PRN medications for symptomatic management:Increase trazodone to 50mg  at bedtime prn x1, given that patient's decrease sleep was one time, will provide additional prn dose. Patient does not appear to have any abnormal behaviors today suggesting possible mania, will continue to monitor.               -- continue acetaminophen 650 mg every 6 hours as needed for mild to moderate pain, fever, and headaches              -- continue hydroxyzine 25 mg three times a day as needed for anxiety              -- continue bismuth subsalicylate 524 mg oral chewable tablet every 3 hours as needed for diarrhea / loose stools              -- continue ondansetron 8 mg every 8 hours as needed for nausea or vomiting              --  continue aluminum-magnesium hydroxide + simethicone 30 mL every 4 hours as needed for heartburn or indigestion              -- Increase trazodone to 50 mg at bedtime as needed x1 for insomnia             -- As needed agitation protocol in-place   The risks/benefits/side-effects/alternatives to the above medication were discussed in detail with the patient and time was given for questions. The patient consents to medication trial. FDA black box warnings, if present, were discussed.   The patient is agreeable with the medication plan, as above. We will monitor the patient's response to  pharmacologic treatment, and adjust medications as necessary.   3. Routine and other pertinent labs:             -- Metabolic profile:   BMI: Body mass index is 25.23 kg/m.   Prolactin: Recent Labs  No results found for: "PROLACTIN"     Lipid Panel: Recent Labs       Lab Results  Component Value Date    CHOL 162 09/10/2023    TRIG 40 09/10/2023    HDL 61 09/10/2023    CHOLHDL 2.7 09/10/2023    VLDL 8 09/10/2023    LDLCALC 93 09/10/2023        HbgA1c: Last Labs     Hgb A1c MFr Bld (%)  Date Value  09/10/2023 5.1        TSH: Last Labs  No results found for: "TSH"     EKG monitoring: QTc: 418   4. Group Therapy:             -- Encouraged patient to participate in unit milieu and in scheduled group therapies              -- Short Term Goals: Ability to identify changes in lifestyle to reduce recurrence of condition, verbalize feelings, identify and develop effective coping behaviors, maintain clinical measurements within normal limits, and identify triggers associated with substance abuse/mental health issues will improve. Improvement in ability to disclose and discuss suicidal ideas, demonstrate self-control, and comply with prescribed medications.             -- Long Term Goals: Improvement in symptoms so as ready for discharge -- Patient is encouraged to participate in group therapy while admitted to the psychiatric unit. -- We will address other chronic and acute stressors, which contributed to the patient's Suicide attempt [facility]) in order to reduce the risk of self-harm at discharge.   5. Discharge Planning:              -- Social work and case management to assist with discharge planning and identification of hospital follow-up needs prior to discharge             -- Estimated LOS: 5-7 days. Consider discharge on Monday             -- Discharge Concerns: Need to establish a safety plan; Medication compliance and effectiveness             -- Discharge Goals:  Return home with outpatient referrals for mental health follow-up including medication management/psychotherapy   I certify that inpatient services furnished can reasonably be expected to improve the patient's condition.    PGY-4 Bobbye Morton, MD 09/12/2023, 3:35 PM

## 2023-09-12 NOTE — Group Note (Unsigned)
Date:  09/13/2023 Time:  1:22 AM  Group Topic/Focus:  Wrap-Up Group:   The focus of this group is to help patients review their daily goal of treatment and discuss progress on daily workbooks.    Participation Level:  Active  Participation Quality:  Appropriate and Sharing  Affect:  Appropriate  Cognitive:  Appropriate  Insight: Appropriate  Engagement in Group:  Engaged  Modes of Intervention:  Activity and Socialization  Additional Comments:  The patient stated that she had a good day today. The patient shared that she was able to go outside and that was something she enjoyed. The patient rated her day 7/10. The patient participated in the group activity.   Kennieth Francois 09/13/2023, 1:22 AM

## 2023-09-12 NOTE — Plan of Care (Signed)
°  Problem: Education: °Goal: Emotional status will improve °Outcome: Progressing °Goal: Mental status will improve °Outcome: Progressing °  °Problem: Activity: °Goal: Interest or engagement in activities will improve °Outcome: Progressing °  °

## 2023-09-13 NOTE — Progress Notes (Signed)
   09/13/23 2210  Psych Admission Type (Psych Patients Only)  Admission Status Voluntary  Psychosocial Assessment  Patient Complaints Anxiety  Eye Contact Fair  Facial Expression Animated  Affect Appropriate to circumstance  Speech Logical/coherent  Interaction Assertive  Motor Activity Other (Comment) (WDL)  Appearance/Hygiene Unremarkable  Behavior Characteristics Appropriate to situation  Mood Anxious;Pleasant  Thought Process  Coherency WDL  Content WDL  Delusions None reported or observed  Perception WDL  Hallucination None reported or observed  Judgment Poor  Confusion None  Danger to Self  Current suicidal ideation? Denies  Agreement Not to Harm Self Yes  Description of Agreement verbal  Danger to Others  Danger to Others None reported or observed

## 2023-09-13 NOTE — [facility] Group Notes (Signed)
[facility] Group Notes:  (Nursing/MHT/Case Management/Adjunct)  Date:  09/13/2023  Time:  9:38 PM  Type of Therapy:  The focus of this group is to help patients establish daily goals to achieve during treatment and discuss how the patient can incorporate goal setting into their daily lives to aide in recovery.    Participation Level:  Active  Participation Quality:  Appropriate  Affect:  Appropriate  Cognitive:  Alert and Appropriate  Insight:  Appropriate, Good, and Improving  Engagement in Group:  Engaged and Supportive  Modes of Intervention:  Role-play and Support  Summary of Progress/Problems: Pt attended group  Granville Lewis 09/13/2023, 9:38 PM

## 2023-09-13 NOTE — Progress Notes (Signed)
Patient rated her depression level 0/10 and her anxiety level 0/10 with 10 being the highest and 0 none. Patient stated her goal for today as, " Accepting the things I cannot change". Medication and group compliant. Patient observed interacting well with some peers. Patient denied SI/HI and AVH. Appetite good on shift. Safety maintained.  09/13/23 0820  Psych Admission Type (Psych Patients Only)  Admission Status Voluntary  Psychosocial Assessment  Patient Complaints None  Eye Contact Fair  Facial Expression Anxious  Affect Appropriate to circumstance  Speech Logical/coherent  Interaction Assertive  Motor Activity Other (Comment) (Q15 minute observation)  Appearance/Hygiene Unremarkable  Behavior Characteristics Appropriate to situation  Mood Anxious;Pleasant  Thought Process  Coherency WDL  Content WDL  Delusions None reported or observed  Perception WDL  Hallucination None reported or observed  Judgment Limited  Confusion None  Danger to Self  Current suicidal ideation? Denies  Agreement Not to Harm Self Yes  Description of Agreement Verbal  Danger to Others  Danger to Others None reported or observed

## 2023-09-13 NOTE — Plan of Care (Signed)
  Problem: Education: Goal: Emotional status will improve Outcome: Progressing   Problem: Education: Goal: Mental status will improve Outcome: Progressing   Problem: Activity: Goal: Interest or engagement in activities will improve Outcome: Progressing Goal: Sleeping patterns will improve Outcome: Progressing   Problem: Activity: Goal: Sleeping patterns will improve Outcome: Progressing   Problem: Coping: Goal: Ability to demonstrate self-control will improve Outcome: Progressing   Problem: Safety: Goal: Periods of time without injury will increase Outcome: Progressing   Problem: Clinical Measurements: Goal: Respiratory complications will improve Outcome: Progressing

## 2023-09-13 NOTE — Progress Notes (Addendum)
[facility] MD Progress Note  09/13/2023 1:39 PM Marie Rice  MRN:  [id number] Subjective:   Marie Rice is a 37 y.o., female with a past psychiatric history of bipolar disorder  who presents to the [facility] Voluntary from the behavioral health urgent care [facility]) for evaluation and management of a recent intentional benadryl overdose and suicidal thoughts.  (admitted on 09/08/2023, total  LOS: 4 days )   Chart Review from last 24 hours:  The patient's chart was reviewed and nursing notes were reviewed. The patient's case was discussed in multidisciplinary team meeting.    - Overnight events to report per chart review / staff report: no notable overnight events to report - Patient received all scheduled medications - Patient received trazodone 50mg  at bedtime prn for sleep   Information Obtained Today During Patient Interview:   Yesterday the psychiatry team made the following recommendations:             -- Continue amantadine 50 mg bid             -- Continue Wellbutrin XL 150 mg daily   -- Change trazodone to 50mg  at bedtime prn x1  -- Prolactin level  On assessment this AM patient reports she continues to feel a bit better each day. Patient reports that her emotions feel  less numb and "a  little" easier to communicate how she feels. Patient also reports that her thoughts do not feel as slow as they did prior to admission. Patient reports that she does not feel as depressed and reports that she is not having any racing thoughts as well. Patient denies feeling on edge or worried. Patient endorses good sleep last night and did not require additional trazodone. Patient also reports that she is able to enjoy things a bit more and has a good appetite. Patient denies SI, HI, and AVH. Patient also denies symptoms of paranoia or delusions.  Patient did endorse galactorrhea that has been going on for 5 years. Patient reports she never stopped lactating after  having her now 22 yo child. Patient denies amenorrhea.    Principal Problem: Suicide attempt [facility]) Diagnosis: Principal Problem:   Suicide attempt (HCC)  Total Time spent with patient: 15 minutes  Past Psychiatric History: - Previous psych diagnoses: Bipolar affective disorder, paranoia - Prior inpatient psychiatric treatment: Reports three previous psychiatric hospitalizations: most recent in [place] around 2 months ago, [place], and in [place].  - Prior outpatient psychiatric treatment: Previously with [facility]. Reports not seeing them anymore because they recommended she get more intensive counseling/therapy than what they provide. - Prior psychiatric provider: in [place] Neuromodulation history: denies   Current therapist: Has outpatient appointment set up for 9/30 to establish care. Psychotherapy hx: Denies   History of suicide attempts: Denies History of homicide: Denies Psychotropic medications: Recent Invega long acting injection - patient reportedly taking consistently, reports adverse effect, namely depression. First dose 03/2023, second dose 06/2023. Next dose scheduled for October   Past Risperdal 1 mg - patient reports good response. Patient reports discontinuing because she became manic and threw away all her medicines. Subsequently, she was started on the Invega LAI that is dosed to be given every three months. Past Medical History:  Past Medical History:  Diagnosis Date   Asthma    Bipolar 1 disorder (HCC)     Past Surgical History:  Procedure Laterality Date   CESAREAN SECTION     Family History: History reviewed. No pertinent family history. Family Psychiatric  History: see h&p Social History:  Social History   Substance and Sexual Activity  Alcohol Use Not Currently     Social History   Substance and Sexual Activity  Drug Use Not Currently    Social History   Socioeconomic History   Marital status: Married    Spouse name: Not on file    Number of children: Not on file   Years of education: Not on file   Highest education level: Not on file  Occupational History   Not on file  Tobacco Use   Smoking status: Never   Smokeless tobacco: Never  Vaping Use   Vaping status: Unknown  Substance and Sexual Activity   Alcohol use: Not Currently   Drug use: Not Currently   Sexual activity: Yes  Other Topics Concern   Not on file  Social History Narrative   Not on file   Social Determinants of Health   Financial Resource Strain: Low Risk  (08/25/2023)   Received from [facility]   Overall Financial Resource Strain (CARDIA)    Difficulty of Paying Living Expenses: Not very hard  Food Insecurity: Unknown (09/08/2023)   Hunger Vital Sign    Worried About Running Out of Food in the Last Year: Not on file    Ran Out of Food in the Last Year: Never true  Transportation Needs: No Transportation Needs (09/08/2023)   PRAPARE - Administrator, Civil Service (Medical): No    Lack of Transportation (Non-Medical): No  Physical Activity: Unknown (08/25/2023)   Received from [facility]   Exercise Vital Sign    Days of Exercise per Week: 0 days    Minutes of Exercise per Session: Not on file  Stress: Stress Concern Present (08/25/2023)   Received from [facility] - Occupational Stress Questionnaire    Feeling of Stress : Rather much  Social Connections: Socially Integrated (08/25/2023)   Received from [facility]   Social Network    How would you rate your social network (family, work, friends)?: Good participation with social networks   Additional Social History:                         Sleep: Good  Appetite:  Good  Current Medications: Current Facility-Administered Medications  Medication Dose Route Frequency Provider Last Rate Last Admin   acetaminophen (TYLENOL) tablet 650 mg  650 mg Oral Q6H PRN Eligha Bridegroom, NP       alum & mag hydroxide-simeth  (MAALOX/MYLANTA) 200-200-20 MG/5ML suspension 30 mL  30 mL Oral Q4H PRN Eligha Bridegroom, NP       amantadine (SYMMETREL) 50 MG/5ML solution 50 mg  50 mg Oral Q12H Massengill, Nathan, MD   50 mg at 09/13/23 0745   buPROPion (WELLBUTRIN XL) 24 hr tablet 150 mg  150 mg Oral Daily Massengill, Harrold Donath, MD   150 mg at 09/13/23 0744   haloperidol (HALDOL) tablet 5 mg  5 mg Oral TID PRN Eligha Bridegroom, NP       Or   haloperidol lactate (HALDOL) injection 5 mg  5 mg Intramuscular TID PRN Eligha Bridegroom, NP       hydrOXYzine (ATARAX) tablet 25 mg  25 mg Oral TID PRN Eligha Bridegroom, NP   25 mg at 09/08/23 2112   LORazepam (ATIVAN) tablet 2 mg  2 mg Oral TID PRN Eligha Bridegroom, NP       Or   LORazepam (  ATIVAN) injection 2 mg  2 mg Intramuscular TID PRN Eligha Bridegroom, NP       magnesium hydroxide (MILK OF MAGNESIA) suspension 30 mL  30 mL Oral Daily PRN Eligha Bridegroom, NP       traZODone (DESYREL) tablet 50 mg  50 mg Oral QHS,MR X 1 Kawanda Drumheller B, MD   50 mg at 09/12/23 2150    Lab Results: No results found for this or any previous visit (from the past 48 hour(s)).  Blood Alcohol level:  Lab Results  Component Value Date   ETH <10 09/07/2023    Metabolic Disorder Labs: Lab Results  Component Value Date   HGBA1C 5.1 09/10/2023   MPG 99.67 09/10/2023   No results found for: "PROLACTIN" Lab Results  Component Value Date   CHOL 162 09/10/2023   TRIG 40 09/10/2023   HDL 61 09/10/2023   CHOLHDL 2.7 09/10/2023   VLDL 8 09/10/2023   LDLCALC 93 09/10/2023    Physical Findings: AIMS: Facial and Oral Movements Muscles of Facial Expression: None, normal Lips and Perioral Area: None, normal Jaw: None, normal Tongue: None, normal,Extremity Movements Upper (arms, wrists, hands, fingers): None, normal Lower (legs, knees, ankles, toes): None, normal, Trunk Movements Neck, shoulders, hips: None, normal, Overall Severity Severity of abnormal movements (highest score from questions  above): None, normal Incapacitation due to abnormal movements: None, normal Patient's awareness of abnormal movements (rate only patient's report): No Awareness, Dental Status Current problems with teeth and/or dentures?: No Does patient usually wear dentures?: No  CIWA:    COWS:     Musculoskeletal: Strength & Muscle Tone: within normal limits Gait & Station: normal Patient leans: N/A  Psychiatric Specialty Exam:  Presentation  General Appearance:  Appropriate for Environment; Casual  Eye Contact: Good  Speech: Clear and Coherent  Speech Volume: Normal  Handedness: Right   Mood and Affect  Mood: Euthymic ("less numb")  Affect: Flat; Constricted   Thought Process  Thought Processes: Coherent  Descriptions of Associations:Intact  Orientation:Full (Time, Place and Person)  Thought Content:Logical  History of Schizophrenia/Schizoaffective disorder:No  Duration of Psychotic Symptoms:No data recorded Hallucinations:Hallucinations: None  Ideas of Reference:None  Suicidal Thoughts:Suicidal Thoughts: No  Homicidal Thoughts:Homicidal Thoughts: No   Sensorium  Memory: Immediate Fair; Recent Fair  Judgment: Fair  Insight: Good   Executive Functions  Concentration: Fair  Attention Span: Good  Recall: Good  Fund of Knowledge: Good  Language: Good   Psychomotor Activity  Psychomotor Activity: Psychomotor Activity: Normal   Assets  Assets: Desire for Improvement; Communication Skills; Housing; Leisure Time; Resilience; Social Support   Sleep  Sleep: Sleep: Good Number of Hours of Sleep: 7.5    Physical Exam: Physical Exam Constitutional:      Appearance: Normal appearance.  HENT:     Head: Normocephalic and atraumatic.  Pulmonary:     Effort: Pulmonary effort is normal.  Neurological:     Mental Status: She is alert and oriented to person, place, and time.    Review of Systems  Psychiatric/Behavioral:  Negative  for hallucinations and suicidal ideas. The patient is not nervous/anxious and does not have insomnia.    Blood pressure 111/68, pulse (!) 107, temperature (!) 97.4 F (36.3 C), temperature source Oral, resp. rate 16, height 5\' 4"  (1.626 m), weight 66.7 kg, SpO2 98%. Body mass index is 25.23 kg/m.   Treatment Plan Summary: Daily contact with patient to assess and evaluate symptoms and progress in treatment and Medication management  ASSESSMENT: Bipolar disorder with  acute severe depression Substance-induced depression Recent suicide attempt via intentional overdose on Benadryl     PLAN: Safety and Monitoring:             -- Voluntary admission to inpatient psychiatric unit for safety, stabilization and treatment             -- Daily contact with patient to assess and evaluate symptoms and progress in treatment             -- Patient's case to be discussed in multi-disciplinary team meeting             -- Observation Level : q15 minute checks             -- Vital signs: q12 hours             -- Precautions: suicide, elopement, and assault   2. Interventions (medications, psychoeducation, etc):  Pt has a history of bipolar disorder usually presenting with mania and presented to [facility] in a depressive episode. Additionally, she had two recent suicide attempts by intentional drug overdose.  Patient presented with flat affect and antipsychotic negative symptoms similar to the masked facies seen in Parkinsonism, which have resolved.  Consider management with less dopamine blockade. However, patient recently started on amantadine and bupropion, and will need additional monitoring for potential development of manic symptoms in the setting of increased dopamine production. Will also obtain prolactin level given concern for sensitivity to dopamine blockade.               -- Continue amantadine 50 mg bid             -- Continue Wellbutrin XL 150 mg daily              -- A1c 5.1, lipid panel and BMP  WNL             -- Patient's husband reported pt behaving more like herself and is more upbeat, bright, humorous, 9/26             -- Patient does not need nicotine replacement   PRN medications for symptomatic management:Increase trazodone to 50mg  at bedtime prn x1, given that patient's decrease sleep was one time, will provide additional prn dose. Patient does not appear to have any abnormal behaviors today suggesting possible mania, will continue to monitor.               -- continue acetaminophen 650 mg every 6 hours as needed for mild to moderate pain, fever, and headaches              -- continue hydroxyzine 25 mg three times a day as needed for anxiety              -- continue bismuth subsalicylate 524 mg oral chewable tablet every 3 hours as needed for diarrhea / loose stools              -- continue ondansetron 8 mg every 8 hours as needed for nausea or vomiting              -- continue aluminum-magnesium hydroxide + simethicone 30 mL every 4 hours as needed for heartburn or indigestion              -- Increase trazodone to 50 mg at bedtime as needed x1 for insomnia             -- As needed agitation protocol in-place   The risks/benefits/side-effects/alternatives to the  above medication were discussed in detail with the patient and time was given for questions. The patient consents to medication trial. FDA black box warnings, if present, were discussed.   The patient is agreeable with the medication plan, as above. We will monitor the patient's response to pharmacologic treatment, and adjust medications as necessary.   3. Routine and other pertinent labs: Discussed with patient that if she has hyperprolactinemia this may require her meds be adjusted in the future.             -- Metabolic profile:   BMI: Body mass index is 25.23 kg/m.   Prolactin:PENDING Recent Labs  TSH: Last Labs  No results found for: "TSH"      EKG monitoring: QTc: 418   4. Group Therapy:              -- Encouraged patient to participate in unit milieu and in scheduled group therapies              -- Short Term Goals: Ability to identify changes in lifestyle to reduce recurrence of condition, verbalize feelings, identify and develop effective coping behaviors, maintain clinical measurements within normal limits, and identify triggers associated with substance abuse/mental health issues will improve. Improvement in ability to disclose and discuss suicidal ideas, demonstrate self-control, and comply with prescribed medications.             -- Long Term Goals: Improvement in symptoms so as ready for discharge -- Patient is encouraged to participate in group therapy while admitted to the psychiatric unit. -- We will address other chronic and acute stressors, which contributed to the patient's Suicide attempt [facility]) in order to reduce the risk of self-harm at discharge.   5. Discharge Planning:              -- Social work and case management to assist with discharge planning and identification of hospital follow-up needs prior to discharge             -- Estimated LOS: 5-7 days. Consider discharge on Monday             -- Discharge Concerns: Need to establish a safety plan; Medication compliance and effectiveness             -- Discharge Goals: Return home with outpatient referrals for mental health follow-up including medication management/psychotherapy   I certify that inpatient services furnished can reasonably be expected to improve the patient's condition.  PGY-4 Bobbye Morton, MD 09/13/2023, 1:39 PM

## 2023-09-13 NOTE — Progress Notes (Signed)
   09/13/23 0708  15 Minute Checks  Location Cafeteria  Visual Appearance Calm  Behavior Composed  Sleep (Behavioral Health Patients Only)  Calculate sleep? (Click Yes once per 24 hr at 0600 safety check) Yes  Documented sleep last 24 hours 7.5

## 2023-09-13 NOTE — Group Note (Signed)
Date:  09/13/2023 Time:  10:53 AM  Group Topic/Focus:  Goals Group:   The focus of this group is to help patients establish daily goals to achieve during treatment and discuss how the patient can incorporate goal setting into their daily lives to aide in recovery.    Participation Level:  Active  Participation Quality:  Attentive  Affect:  Appropriate  Cognitive:  Appropriate  Insight: Appropriate  Engagement in Group:  Engaged  Modes of Intervention:  Discussion  Additional Comments:  Patient attended goals group and was attentive the duration of it. Patient's goal was to come up with a discharge plan.   Yossef Gilkison T Lorraine Lax 09/13/2023, 10:53 AM

## 2023-09-14 ENCOUNTER — Encounter (HOSPITAL_COMMUNITY): Payer: Self-pay

## 2023-09-14 DIAGNOSIS — F319 Bipolar disorder, unspecified: Principal | ICD-10-CM

## 2023-09-14 DIAGNOSIS — R7989 Other specified abnormal findings of blood chemistry: Secondary | ICD-10-CM | POA: Insufficient documentation

## 2023-09-14 LAB — PROLACTIN: Prolactin: 177 ng/mL — ABNORMAL HIGH (ref 4.8–33.4)

## 2023-09-14 MED ORDER — TRAZODONE HCL 50 MG PO TABS: 50.0000 mg | ORAL_TABLET | Freq: Every evening | ORAL | 0 refills | Status: DC | PRN

## 2023-09-14 MED ORDER — HYDROXYZINE HCL 25 MG PO TABS: 25.0000 mg | ORAL_TABLET | Freq: Three times a day (TID) | ORAL | 0 refills | Status: DC | PRN

## 2023-09-14 MED ORDER — AMANTADINE HCL 50 MG/5ML PO SOLN: 50.0000 mg | Freq: Two times a day (BID) | ORAL | 0 refills | Status: DC

## 2023-09-14 MED ORDER — BUPROPION HCL ER (XL) 150 MG PO TB24: 150.0000 mg | ORAL_TABLET | Freq: Every day | ORAL | 0 refills | Status: DC

## 2023-09-14 NOTE — BHH Suicide Risk Assessment (Signed)
[facility] Discharge Suicide Risk Assessment   Principal Problem: Bipolar 1 disorder, depressed (HCC) Discharge Diagnoses: Principal Problem:   Bipolar 1 disorder, depressed (HCC) Active Problems:   Elevated prolactin level  Reason for Admission:    Marie Rice is a 37 y.o., female with a past psychiatric history of bipolar disorder  who presents to the [facility] Voluntary from the behavioral health urgent care [facility]) for evaluation and management of a recent intentional benadryl overdose and suicidal thoughts.    Hospital Summary   During the patient's hospitalization, patient had extensive initial psychiatric evaluation, and follow-up psychiatric evaluations every day.   Psychiatric diagnoses provided upon initial assessment:  Bipolar Type 1 Disorder, with acute severe depressive episode  Suicide attempt with intentional benadryl overdose    Patient's psychiatric medications were adjusted on admission:  Deferred starting antipsychotic given patient, previously received a long acting injectable of Invega (unknown dosage) a few months prior to admission.    During the hospitalization, other adjustments were made to the patient's psychiatric medication regimen:  Started Wellbutrin XL 150 mg daily  Started Amantadine 50 mg every 12 hours    Gradually, patient started adjusting to milieu.   Patient's care was discussed during the interdisciplinary team meeting every day during the hospitalization.   The patient denies having side effects to prescribed psychiatric medication.   The patient reports their target psychiatric symptoms of depression and anxiety responded well to the psychiatric medications, and the patient reports overall benefit other psychiatric hospitalization. Supportive psychotherapy was provided to the patient. The patient also participated in regular group therapy while admitted.    Labs were reviewed with the patient, and abnormal results  were discussed with the patient.   The patient denied having suicidal thoughts more than 48 hours prior to discharge.  Patient denies having homicidal thoughts.  Patient denies having auditory hallucinations.  Patient denies any visual hallucinations.  Patient denies having paranoid thoughts. Discussed elevated prolactin level with patient and gave the option to remain inpatient to pursue further work w/ imaging and treatment vs pursuing work up outpatient. Patient stated that her lactation has been persistent for 5 years prior to starting on any antipsychotics. Discussed that her antipsychotic regimen many have worsened her galactorrhea, however work-up and imaging should be pursued. Patient amenable to pursue imaging and workup outpatient. Recommended to follow-up with [facility] PCP and [facility] Endocrinology referral was also placed. She desires afternoon discharge to husband. Discussed the recommendation of discontinuing long acting injectable Invega and transitioning to an oral medication such as risperdal, abiiify or another antipsychotic. Suspected that her injectable dosage may have been to high, however, unable to confirm due to unknown dosage amount. Patient amenable to discussing with outpatient psychiatrist at follow-up.    The patient is able to verbalize their individual safety plan to this provider.   It is recommended to the patient to continue psychiatric medications as prescribed, after discharge from the hospital.     It is recommended to the patient to follow up with your outpatient psychiatric provider and PCP.   Discussed with the patient, the impact of alcohol, drugs, tobacco have been there overall psychiatric and medical wellbeing, and total abstinence from substance use was recommended the patient.    Total Time spent with patient: 20 minutes  Musculoskeletal: Strength & Muscle Tone: within normal limits Gait & Station: normal Patient leans: N/A  Psychiatric Specialty  Exam  Presentation  General Appearance:  Appropriate for Environment; Casual; Fairly Groomed  Eye  Contact: Good  Speech: Normal Rate; Clear and Coherent  Speech Volume: Normal  Handedness: Right   Mood and Affect  Mood: Euthymic  Duration of Depression Symptoms: Greater than two weeks  Affect: Appropriate; Congruent; Full Range   Thought Process  Thought Processes: Linear  Descriptions of Associations:Intact  Orientation:Full (Time, Place and Person)  Thought Content:Logical  History of Schizophrenia/Schizoaffective disorder:No  Duration of Psychotic Symptoms:None Hallucinations:Hallucinations: None  Ideas of Reference:None  Suicidal Thoughts:Suicidal Thoughts: No  Homicidal Thoughts:Homicidal Thoughts: No   Sensorium  Memory: Immediate Good; Recent Good; Remote Good  Judgment: Fair  Insight: Fair   Art therapist  Concentration: Good  Attention Span: Fair  Recall: Good  Fund of Knowledge: Good  Language: Good   Psychomotor Activity  Psychomotor Activity: Psychomotor Activity: Normal   Assets  Assets: Communication Skills; Desire for Improvement; Housing; Social Support; Leisure Time; Resilience   Sleep  Sleep: Sleep: Fair Number of Hours of Sleep: 7   Physical Exam: Physical Exam: Physical Exam Constitutional:      Appearance: Normal appearance.  Pulmonary:     Effort: Pulmonary effort is normal.  Neurological:     Mental Status: She is alert.  Psychiatric:        Attention and Perception: She does not perceive auditory or visual hallucinations.        Mood and Affect: Mood is not anxious or depressed.        Speech: She is communicative. Speech is not rapid and pressured.        Behavior: Behavior is cooperative.        Thought Content: Thought content is not paranoid or delusional. Thought content does not include homicidal or suicidal ideation. Thought content does not include homicidal or suicidal  plan.     Comments: Constricted affect    Review of Systems  Constitutional:  Negative for chills and fever.  Eyes:  Negative for blurred vision.  Respiratory:  Negative for cough.   Cardiovascular:  Negative for chest pain.  Gastrointestinal:  Negative for abdominal pain, nausea and vomiting.  Neurological:  Negative for weakness and headaches.  Psychiatric/Behavioral:  Negative for depression, hallucinations, memory loss, substance abuse and suicidal ideas. The patient is not nervous/anxious and does not have insomnia.     Blood pressure 120/68, pulse (!) 108, temperature 98 F (36.7 C), temperature source Oral, resp. rate 16, height 5\' 4"  (1.626 m), weight 66.7 kg, SpO2 100%. Body mass index is 25.23 kg/m.  Mental Status Per Nursing Assessment::   On Admission:  NA  Demographic Factors:  NA  Loss Factors: NA  Historical Factors: Impulsivity  Risk Reduction Factors:   Sense of responsibility to family, Religious beliefs about death, Employed, Living with another person, especially a relative, and Positive social support  Continued Clinical Symptoms:  Bipolar Disorder:   Depressive phase  Cognitive Features That Contribute To Risk:  None    Suicide Risk:  Mild:  Suicidal ideation of limited frequency, intensity, duration, and specificity.  There are no identifiable plans, no associated intent, mild dysphoria and related symptoms, good self-control (both objective and subjective assessment), few other risk factors, and identifiable protective factors, including available and accessible social support.   Follow-up Information     [facility] Follow up on 09/22/2023.   Why: You have a hospital follow up appointment for therapy and medication management services on 09/22/23 at 9:00 am.  This will be a Virtual telehealth appt. Contact information: [address]  [address] [place] [address]  [phone]         [facility] Endocrinology. Call in 1 week(s).    Specialty: Endocrinology Why: Call to make appointment please to discuss elevated Prolactin 177. Contact information: [address], [address] [place] [address] [phone]                Plan Of Care/Follow-up recommendations:  You will need to follow-up as an outpatient for evaluation and treatment of your high prolactin level.  You were offered workup, including head imaging of your pituitary glad, while in this hospital, but you communicated that you would rather have this work-up done as an outpatient. We also offered to start medication to address your high prolactin level, but you declined starting this medication in the inpatient setting as well.    -Follow-up with your outpatient psychiatric provider -instructions on appointment date, time, and address (location) are provided to you in discharge paperwork. Recommend NOT continuing the Invega LAI, due to negative symptoms caused by this medication and also the elevated PRL level. I recommend that when the next lai dose is due, the pt either be transitioned to oral invega/risperdal (at a lower dose), or tranisitioned to abilify or a different PRL-neutral mood stabilizer.    -Take your psychiatric medications as prescribed at discharge - instructions are provided to you in the discharge paperwork   -Follow-up with outpatient primary care doctor and other specialists -for management of preventative medicine and any chronic medical disease.   -You are also getting a referral to an endocrinologist for evaluation of your elevated prolactin levels. Please also notify your PCP and outpatient psychiatrist of your elevated prolactin levels.    -Recommend abstinence from alcohol, tobacco, and other illicit drug use at discharge.    -If your psychiatric symptoms recur, worsen, or if you have side effects to your psychiatric medications, call your outpatient psychiatric provider, 911, [phone] or go to the nearest  emergency department.   -If suicidal thoughts occur, call your outpatient psychiatric provider, 911, [phone] or go to the nearest emergency department.   Peterson Ao, MD 09/14/2023, 1:30 PM

## 2023-09-14 NOTE — Progress Notes (Signed)
  [facility] Adult Case Management Discharge Plan :  Will you be returning to the same living situation after discharge:  Yes,  Pt will be returning home with husband At discharge, do you have transportation home?: Yes,  Pts husband will be picking her up around 4pm.  Do you have the ability to pay for your medications: Yes,  [employer]  Release of information consent forms completed and in the chart;  Patient's signature needed at discharge.  Patient to Follow up at:  Follow-up Information     [facility] Follow up on 09/22/2023.   Why: You have a hospital follow up appointment for therapy and medication management services on 09/22/23 at 9:00 am.  This will be a Virtual telehealth appt. Contact information: [address]  [address] [place] [address] [phone]         [facility] Endocrinology. Call in 1 week(s).   Specialty: Endocrinology Why: Call to make appointment please to discuss elevated Prolactin 177. Contact information: [address] [place] [address] [phone]                Next level of care provider has access to [facility] Link:no  Safety Planning and Suicide Prevention discussed: Yes,  [name] (husband) [phone]     Has patient been referred to the Quitline?: Patient does not use tobacco/nicotine products  Patient has been referred for addiction treatment: No known substance use disorder.  Izell [place], LCSW 09/14/2023, 2:37 PM

## 2023-09-14 NOTE — Progress Notes (Deleted)
[facility] Discharge Suicide Risk Assessment  Principal Problem: Bipolar 1 disorder, depressed (HCC) Discharge Diagnoses: Principal Problem:   Bipolar 1 disorder, depressed (HCC)  Reason for Admission:   Marie Rice is a 37 y.o., female with a past psychiatric history of bipolar disorder  who presents to the [facility] Voluntary from the behavioral health urgent care [facility]) for evaluation and management of a recent intentional benadryl overdose and suicidal thoughts.   Hospital Summary  During the patient's hospitalization, patient had extensive initial psychiatric evaluation, and follow-up psychiatric evaluations every day.  Psychiatric diagnoses provided upon initial assessment:  Bipolar Type 1 Disorder, with acute severe depressive episode  Suicide attempt with intentional benadryl overdose   Patient's psychiatric medications were adjusted on admission:  Deferred starting antipsychotic given patient, previously received a long acting injectable of Invega (unknown dosage) a few months prior to admission.   During the hospitalization, other adjustments were made to the patient's psychiatric medication regimen:  Started Wellbutrin XL 150 mg daily  Started Amantadine 50 mg every 12 hours   Gradually, patient started adjusting to milieu.   Patient's care was discussed during the interdisciplinary team meeting every day during the hospitalization.  The patient denies having side effects to prescribed psychiatric medication.  The patient reports their target psychiatric symptoms of depression and anxiety responded well to the psychiatric medications, and the patient reports overall benefit other psychiatric hospitalization. Supportive psychotherapy was provided to the patient. The patient also participated in regular group therapy while admitted.   Labs were reviewed with the patient, and abnormal results were discussed with the patient.  The patient denied  having suicidal thoughts more than 48 hours prior to discharge.  Patient denies having homicidal thoughts.  Patient denies having auditory hallucinations.  Patient denies any visual hallucinations.  Patient denies having paranoid thoughts. Discussed elevated prolactin level with patient and gave the option to remain inpatient to pursue further work w/ imaging and treatment vs pursuing work up outpatient. Patient stated that her lactation has been persistent for 5 years prior to starting on any antipsychotics. Discussed that her antipsychotic regimen many have worsened her galactorrhea, however work-up and imaging should be pursued. Patient amenable to pursue imaging and workup outpatient. Recommended to follow-up with [facility] PCP and [facility] Endocrinology referral was also placed. She desires afternoon discharge to husband. Discussed the recommendation of discontinuing long acting injectable Invega and transitioning to an oral medication such as risperdal, abiiify or another antipsychotic. Suspected that her injectable dosage may have been to high, however, unable to confirm due to unknown dosage amount. Patient amenable to discussing with outpatient psychiatrist at follow-up.   The patient is able to verbalize their individual safety plan to this provider.  It is recommended to the patient to continue psychiatric medications as prescribed, after discharge from the hospital.    It is recommended to the patient to follow up with your outpatient psychiatric provider and PCP.  Discussed with the patient, the impact of alcohol, drugs, tobacco have been there overall psychiatric and medical wellbeing, and total abstinence from substance use was recommended the patient.   Total Time spent with patient: 20 minutes  Musculoskeletal: Strength & Muscle Tone: within normal limits Gait & Station: normal Patient leans: N/A  Psychiatric Specialty Exam  Presentation  General Appearance: Appropriate for Environment;  Casual; Fairly Groomed  Eye Contact:Good  Speech:Normal Rate; Clear and Coherent  Speech Volume:Normal  Handedness:Right   Mood and Affect  Mood:Euthymic  Duration of Depression Symptoms: Greater  than two weeks  Affect:Appropriate; Congruent; Full Range   Thought Process  Thought Processes:Linear  Descriptions of Associations:Intact  Orientation:Full (Time, Place and Person)  Thought Content:Logical  History of Schizophrenia/Schizoaffective disorder:No  Duration of Psychotic Symptoms: None  Hallucinations:Hallucinations: None  Ideas of Reference:None  Suicidal Thoughts:Suicidal Thoughts: No  Homicidal Thoughts:Homicidal Thoughts: No   Sensorium  Memory:Immediate Good; Recent Good; Remote Good  Judgment:Fair  Insight:Fair   Executive Functions  Concentration:Good  Attention Span:Fair  Recall:Good  Fund of Knowledge:Good  Language:Good   Psychomotor Activity  Psychomotor Activity:Psychomotor Activity: Normal   Assets  Assets:Communication Skills; Desire for Improvement; Housing; Social Support; Leisure Time; Resilience   Sleep  Sleep:Sleep: Fair Number of Hours of Sleep: 7   Physical Exam: Physical Exam Constitutional:      Appearance: Normal appearance.  Pulmonary:     Effort: Pulmonary effort is normal.  Neurological:     Mental Status: She is alert.  Psychiatric:        Attention and Perception: She does not perceive auditory or visual hallucinations.        Mood and Affect: Mood is not anxious or depressed.        Speech: She is communicative. Speech is not rapid and pressured.        Behavior: Behavior is cooperative.        Thought Content: Thought content is not paranoid or delusional. Thought content does not include homicidal or suicidal ideation. Thought content does not include homicidal or suicidal plan.     Comments: Constricted affect    Review of Systems  Constitutional:  Negative for chills and fever.  Eyes:   Negative for blurred vision.  Respiratory:  Negative for cough.   Cardiovascular:  Negative for chest pain.  Gastrointestinal:  Negative for abdominal pain, nausea and vomiting.  Neurological:  Negative for weakness and headaches.  Psychiatric/Behavioral:  Negative for depression, hallucinations, memory loss, substance abuse and suicidal ideas. The patient is not nervous/anxious and does not have insomnia.    Blood pressure 120/68, pulse (!) 108, temperature 98 F (36.7 C), temperature source Oral, resp. rate 16, height 5\' 4"  (1.626 m), weight 66.7 kg, SpO2 100%. Body mass index is 25.23 kg/m.  Mental Status Per Nursing Assessment::   On Admission:  NA  Demographic Factors:  NA  Loss Factors: NA  Historical Factors: Impulsivity  Risk Reduction Factors:   Responsible for children under 40 years of age, Sense of responsibility to family, Employed, Living with another person, especially a relative, and Positive social support  Continued Clinical Symptoms:  Bipolar Disorder:   Depressive phase  Cognitive Features That Contribute To Risk:  None    Suicide Risk:  Mild: There are no identifiable suicide plans, no associated intent, mild dysphoria and related symptoms, good self-control (both objective and subjective assessment), few other risk factors, and identifiable protective factors, including available and accessible social support.   Follow-up Information     [facility] Follow up on 09/22/2023.   Why: You have a hospital follow up appointment for therapy and medication management services on 09/22/23 at 9:00 am.  This will be a Virtual telehealth appt. Contact information: [address]  [address] [place] [address] [phone]         [facility] Endocrinology. Call in 1 week(s).   Specialty: Endocrinology Why: Call to make appointment please to discuss elevated Prolactin 177. Contact information: [address], [address] [place]  [address] [phone]  Plan Of Care/Follow-up recommendations:   You will need to follow-up as an outpatient for evaluation and treatment of your high prolactin level.  You were offered workup, including head imaging of your pituitary glad, while in this hospital, but you communicated that you would rather have this work-up done as an outpatient. We also offered to start medication to address your high prolactin level, but you declined starting this medication in the inpatient setting as well.   -Follow-up with your outpatient psychiatric provider -instructions on appointment date, time, and address (location) are provided to you in discharge paperwork. Recommend NOT continuing the Invega LAI, due to negative symptoms caused by this medication and also the elevated PRL level. I recommend that when the next lai dose is due, the pt either be transitioned to oral invega/risperdal (at a lower dose), or tranisitioned to abilify or a different PRL-neutral mood stabilizer.   -Take your psychiatric medications as prescribed at discharge - instructions are provided to you in the discharge paperwork  -Follow-up with outpatient primary care doctor and other specialists -for management of preventative medicine and any chronic medical disease.  -You are also getting a referral to an endocrinologist for evaluation of your elevated prolactin levels. Please also notify your PCP and outpatient psychiatrist of your elevated prolactin levels.   -Recommend abstinence from alcohol, tobacco, and other illicit drug use at discharge.   -If your psychiatric symptoms recur, worsen, or if you have side effects to your psychiatric medications, call your outpatient psychiatric provider, 911, [phone] or go to the nearest emergency department.  -If suicidal thoughts occur, call your outpatient psychiatric provider, 911, [phone] or go to the nearest emergency department. Signed: Peterson Ao, MD 09/14/2023,  12:01 PM

## 2023-09-14 NOTE — Progress Notes (Signed)
Patient discharged to home accompanied by family member. Discharged instructions, all required discharge documents and information about follow-up appointment given to pt with verbalization of understanding. Patient denied SI/HI and AVH at time of discharge. Plan of Care resolved. All personal belongings returned to pt at time of discharge. Pt escorted to lobby by MHT.  09/14/23 0815  Psych Admission Type (Psych Patients Only)  Admission Status Voluntary  Psychosocial Assessment  Patient Complaints None  Eye Contact Fair  Facial Expression Animated  Affect Appropriate to circumstance  Speech Logical/coherent  Interaction Assertive  Motor Activity Other (Comment) (WNL)  Appearance/Hygiene Unremarkable  Behavior Characteristics Appropriate to situation  Mood Pleasant  Thought Process  Coherency WDL  Content WDL  Delusions None reported or observed  Perception WDL  Hallucination None reported or observed  Judgment Impaired  Confusion None  Danger to Self  Current suicidal ideation? Denies  Agreement Not to Harm Self Yes  Description of Agreement Verbal  Danger to Others  Danger to Others None reported or observed

## 2023-09-14 NOTE — Discharge Instructions (Addendum)
You will need to follow-up as an outpatient for evaluation and treatment of your high prolactin level.  You were offered workup, including head imaging of your pituitary glad, while in this hospital, but you communicated that you would rather have this work-up done as an outpatient. We also offered to start medication to address your high prolactin level, but you declined starting this medication in the inpatient setting as well.   -Follow-up with your outpatient psychiatric provider -instructions on appointment date, time, and address (location) are provided to you in discharge paperwork. Recommend NOT continuing the Invega LAI, due to negative symptoms caused by this medication and also the elevated PRL level. I recommend that when the next lai dose is due, the pt either be transitioned to oral invega/risperdal (at a lower dose), or tranisitioned to abilify or a different PRL-neutral mood stabilizer.   -Take your psychiatric medications as prescribed at discharge - instructions are provided to you in the discharge paperwork  -Follow-up with outpatient primary care doctor and other specialists -for management of preventative medicine and any chronic medical disease.  -You are also getting a referral to an endocrinologist for evaluation of your elevated prolactin levels. Please also notify your PCP and outpatient psychiatrist of your elevated prolactin levels.   -Recommend abstinence from alcohol, tobacco, and other illicit drug use at discharge.   -If your psychiatric symptoms recur, worsen, or if you have side effects to your psychiatric medications, call your outpatient psychiatric provider, 911, [phone] or go to the nearest emergency department.  -If suicidal thoughts occur, call your outpatient psychiatric provider, 911, 988 or go to the nearest emergency department.

## 2023-09-14 NOTE — Group Note (Signed)
Recreation Therapy Group Note   Group Topic:Stress Management  Group Date: 09/14/2023 Start Time: 0981 End Time: 0956 Facilitators: Shemaiah Round-McCall, LRT,CTRS Location: [address] Dayroom   Group Topic: Stress Management  Goal Area(s) Addresses:  Patient will identify positive stress management techniques. Patient will identify benefits of using stress management post d/c.  Group Description: Meditation. LRT played a meditation from the Calm app that focused on taking in the characteristics of a mountain. It encouraged participates to envision how the mountain stands tall and endures whatever it's confronted with (ie. Changing weather, different seasons, time of day) and encouraged patients to take on that same attitude when they are faced with the challenges of life.   Education:  Stress Management, Discharge Planning.   Education Outcome: Acknowledges Education   Affect/Mood: N/A   Participation Level: Did not attend    Clinical Observations/Individualized Feedback:     Plan: Continue to engage patient in RT group sessions 2-3x/week.   Manolo Bosket-McCall, LRT,CTRS 09/14/2023 11:41 AM

## 2023-09-14 NOTE — Plan of Care (Signed)
  Problem: Education: Goal: Mental status will improve Outcome: Progressing   Problem: Activity: Goal: Sleeping patterns will improve Outcome: Progressing   

## 2023-09-14 NOTE — Discharge Summary (Cosign Needed Addendum)
Physician Discharge Summary Note Patient:  Marie Rice is an 37 y.o., female MRN:  [id number] DOB:  [birth year] Patient phone:  [phone] (home)  Patient address:   [address] [place] [address],  Total Time spent with patient: 20 minutes  Date of Admission:  09/08/2023 Date of Discharge: 09/14/2023  Reason for Admission:    Andre Hutchinson-Walker is a 37 y.o., female with a past psychiatric history of bipolar disorder  who presents to the [facility] from the behavioral health urgent care [facility]) for evaluation and management of a recent intentional benadryl overdose and suicidal thoughts.    Principal Problem: Bipolar 1 disorder, depressed (HCC) Discharge Diagnoses: Principal Problem:   Bipolar 1 disorder, depressed (HCC)  Past Psychiatric History:  - Previous psych diagnoses: Bipolar affective disorder, paranoia - Prior inpatient psychiatric treatment: Reports three previous psychiatric hospitalizations: most recent in [place] around 2 months ago, [place], and in [place].  - Prior outpatient psychiatric treatment: Previously with [facility]. Reports not seeing them anymore because they recommended she get more intensive counseling/therapy than what they provide. - Prior psychiatric provider: [place] - Neuromodulation history: denies - Current therapist: Has outpatient appointment set up for 9/30 to establish care. Psychotherapy hx: Denies History of suicide attempts: Denies History of homicide: Denies   Psychotropic medications: Current Invega long acting injection - patient reportedly taking consistently, reports adverse effect, namely depression x 4-5 months.   Past Risperdal 1 mg - patient reports good response. Patient reports discontinuing because she became manic and threw away all her medicines. Subsequently, she was started on the Invega LAI that is dosed to be given every three months. Substance Use  History: Alcohol: never drinks Tobacco: never smoked, vaped, or chewed tobacco Cannabis (marijuana): never tried Cocaine: never tried Methamphetamines: never tried Psilocybin (mushrooms): never tried Ecstasy (MDMA / molly): never tried LSD (acid): never tried Opiates (fentanyl / heroin): never tried Benzos (Xanax, Klonopin):  Denies. UDS  + for benzodiazepine in the ED during this admission IV drug use: denies Prescribed meds abuse: denies   History of detox: denies History of rehab: denies  Past Medical History:  Past Medical History:  Diagnosis Date   Asthma    Bipolar 1 disorder (HCC)     Past Surgical History:  Procedure Laterality Date   CESAREAN SECTION      Family History:  Medical: Denies Psych: Denies Psych Rx: Denies Suicide: Denies Homicide: Denies Substance use family hx: Denies  Social History:  Place of birth and grew up where: From [place] Abuse: no history of abuse Marital Status: married to husband x 8 years Sexual orientation: straight Children: 4 y.o. daughter Employment: employed as Copywriter, advertising Highest level of education: bachelors degree Housing: living with husband and son in house Finances: Pt worried about finances after she recently bought a house. She is upset it is costly to maintain Legal: no legal issues Military: never served Consulting civil engineer: denies owning any firearms Pills stockpile:  Unknown  Hospital Course:   During the patient's hospitalization, patient had extensive initial psychiatric evaluation, and follow-up psychiatric evaluations every day.   Psychiatric diagnoses provided upon initial assessment:  Bipolar Type 1 Disorder, with acute severe depressive episode  Suicide attempt with intentional benadryl overdose    Patient's psychiatric medications were adjusted on admission:  Deferred starting antipsychotic given patient, previously received a long acting injectable of Invega (unknown dosage) a few months prior to  admission.    During the hospitalization, other adjustments were made to  the patient's psychiatric medication regimen:  Started Wellbutrin XL 150 mg daily  Started Amantadine 50 mg every 12 hours    Gradually, patient started adjusting to milieu.   Patient's care was discussed during the interdisciplinary team meeting every day during the hospitalization.   The patient denies having side effects to prescribed psychiatric medication.   The patient reports their target psychiatric symptoms of depression and anxiety responded well to the psychiatric medications, and the patient reports overall benefit other psychiatric hospitalization. Supportive psychotherapy was provided to the patient. The patient also participated in regular group therapy while admitted.    Labs were reviewed with the patient, and abnormal results were discussed with the patient.   The patient denied having suicidal thoughts more than 48 hours prior to discharge.  Patient denies having homicidal thoughts.  Patient denies having auditory hallucinations.  Patient denies any visual hallucinations.  Patient denies having paranoid thoughts. Discussed elevated prolactin level with patient and gave the option to remain inpatient to pursue further work w/ imaging and treatment vs pursuing work up outpatient. Patient stated that her lactation has been persistent for 5 years prior to starting on any antipsychotics. Discussed that her antipsychotic regimen many have worsened her galactorrhea, however work-up and imaging should be pursued. Patient amenable to pursue imaging and workup outpatient. Recommended to follow-up with [facility] PCP and [facility] Endocrinology referral was also placed. She desires afternoon discharge to husband. Discussed the recommendation of discontinuing long acting injectable Invega and transitioning to an oral medication such as risperdal, abiiify or another antipsychotic. Suspected that her injectable dosage may have been  to high, however, unable to confirm due to unknown dosage amount. Patient amenable to discussing with outpatient psychiatrist at follow-up.    The patient is able to verbalize their individual safety plan to this provider.   It is recommended to the patient to continue psychiatric medications as prescribed, after discharge from the hospital.     It is recommended to the patient to follow up with your outpatient psychiatric provider and PCP about elevated Prolactin level of 177. Endocrinology referral was also placed prior to discharge.    Discussed with the patient, the impact of alcohol, drugs, tobacco have been there overall psychiatric and medical wellbeing, and total abstinence from substance use was recommended the patient. Labs were reviewed with the patient, and abnormal results were discussed with the patient.  The patient is able to verbalize their individual safety plan to this provider.  # Patient was discharged home with a plan to follow up as noted below.  Physical Findings: AIMS: Facial and Oral Movements Muscles of Facial Expression: None, normal Lips and Perioral Area: None, normal Jaw: None, normal Tongue: None, normal,Extremity Movements Upper (arms, wrists, hands, fingers): None, normal Lower (legs, knees, ankles, toes): None, normal, Trunk Movements Neck, shoulders, hips: None, normal, Overall Severity Severity of abnormal movements (highest score from questions above): None, normal Incapacitation due to abnormal movements: None, normal Patient's awareness of abnormal movements (rate only patient's report): No Awareness, Dental Status Current problems with teeth and/or dentures?: No Does patient usually wear dentures?: No   Musculoskeletal: Strength & Muscle Tone: within normal limits Gait & Station: normal Patient leans: N/A  Psychiatric Specialty Exam  Presentation  General Appearance: Appropriate for Environment; Casual; Fairly Groomed  Eye  Contact:Good  Speech:Normal Rate; Clear and Coherent  Speech Volume:Normal  Handedness:Right   Mood and Affect  Mood:Euthymic  Duration of Depression Symptoms: Greater than two weeks  Affect:Appropriate; Congruent; Full  Range   Thought Process  Thought Processes:Linear  Descriptions of Associations:Intact  Orientation:Full (Time, Place and Person)  Thought Content:Logical  History of Schizophrenia/Schizoaffective disorder:No  Duration of Psychotic Symptoms None Hallucinations:Hallucinations: None  Ideas of Reference:None  Suicidal Thoughts:Suicidal Thoughts: No  Homicidal Thoughts:Homicidal Thoughts: No   Sensorium  Memory:Immediate Good; Recent Good; Remote Good  Judgment:Fair  Insight:Fair   Executive Functions  Concentration:Good  Attention Span:Fair  Recall:Good  Fund of Knowledge:Good  Language:Good   Psychomotor Activity  Psychomotor Activity:Psychomotor Activity: Normal   Assets  Assets:Communication Skills; Desire for Improvement; Housing; Social Support; Leisure Time; Resilience   Sleep  Sleep:Sleep: Fair Number of Hours of Sleep: 7  Physical Exam: Physical Exam Constitutional:      Appearance: Normal appearance.  Pulmonary:     Effort: Pulmonary effort is normal.  Neurological:     Mental Status: She is alert.  Psychiatric:        Attention and Perception: She does not perceive auditory or visual hallucinations.        Mood and Affect: Mood is not anxious or depressed.        Speech: She is communicative. Speech is not rapid and pressured.        Behavior: Behavior is cooperative.        Thought Content: Thought content is not paranoid or delusional. Thought content does not include homicidal or suicidal ideation. Thought content does not include homicidal or suicidal plan.     Comments: Constricted affect      Review of Systems  Constitutional:  Negative for chills and fever.  Eyes:  Negative for blurred vision.   Respiratory:  Negative for cough.   Cardiovascular:  Negative for chest pain.  Gastrointestinal:  Negative for abdominal pain, nausea and vomiting.  Neurological:  Negative for weakness and headaches.  Psychiatric/Behavioral:  Negative for depression, hallucinations, memory loss, substance abuse and suicidal ideas. The patient is not nervous/anxious and does not have insomnia.    Blood pressure 120/68, pulse (!) 108, temperature 98 F (36.7 C), temperature source Oral, resp. rate 16, height 5\' 4"  (1.626 m), weight 66.7 kg, SpO2 100%. Body mass index is 25.23 kg/m.  Social History   Tobacco Use  Smoking Status Never  Smokeless Tobacco Never   Tobacco Cessation:  N/A, patient does not currently use tobacco products  Blood Alcohol level:  Lab Results  Component Value Date   ETH <10 09/07/2023    Metabolic Disorder Labs:  Lab Results  Component Value Date   HGBA1C 5.1 09/10/2023   MPG 99.67 09/10/2023   Lab Results  Component Value Date   PROLACTIN 177.0 (H) 09/12/2023   Lab Results  Component Value Date   CHOL 162 09/10/2023   TRIG 40 09/10/2023   HDL 61 09/10/2023   CHOLHDL 2.7 09/10/2023   VLDL 8 09/10/2023   LDLCALC 93 09/10/2023    See Psychiatric Specialty Exam and Suicide Risk Assessment completed by Attending Physician prior to discharge.  Discharge destination:  Home  Is patient on multiple antipsychotic therapies at discharge:  No   Has Patient had three or more failed trials of antipsychotic monotherapy by history:  No  Recommended Plan for Multiple Antipsychotic Therapies: NA  Discharge Instructions     Ambulatory referral to Endocrinology   Complete by: As directed    Elevated Prolactin 177, patient having galactorrhea for 5 years prior to being placed on antipsychotic medication   Diet - low sodium heart healthy   Complete by: As directed  Increase activity slowly   Complete by: As directed       Allergies as of 09/14/2023   No Known  Allergies      Medication List     STOP taking these medications    PRESCRIPTION MEDICATION       TAKE these medications      Indication  amantadine 50 MG/5ML solution Commonly known as: SYMMETREL Take 5 mLs (50 mg total) by mouth every 12 (twelve) hours.  Indication: Extrapyramidal Reaction caused by Medications   buPROPion 150 MG 24 hr tablet Commonly known as: WELLBUTRIN XL Take 1 tablet (150 mg total) by mouth daily. Start taking on: September 15, 2023  Indication: Bipolar Depressive Episode   hydrOXYzine 25 MG tablet Commonly known as: ATARAX Take 1 tablet (25 mg total) by mouth 3 (three) times daily as needed for anxiety.  Indication: Feeling Anxious   traZODone 50 MG tablet Commonly known as: DESYREL Take 1 tablet (50 mg total) by mouth at bedtime as needed for sleep.  Indication: Trouble Sleeping        Follow-up Information     [facility] Follow up on 09/22/2023.   Why: You have a hospital follow up appointment for therapy and medication management services on 09/22/23 at 9:00 am.  This will be a Virtual telehealth appt. Contact information: [address]  [address] [place] [address] [phone]         [facility] Endocrinology. Call in 1 week(s).   Specialty: Endocrinology Why: Call to make appointment please to discuss elevated Prolactin 177. Contact information: [address], [address] [place] [address] [phone]                Follow-up recommendations / Comments: You will need to follow-up as an outpatient for evaluation and treatment of your high prolactin level.  You were offered workup, including head imaging of your pituitary glad, while in this hospital, but you communicated that you would rather have this work-up done as an outpatient. We also offered to start medication to address your high prolactin level, but you declined starting this medication in the inpatient setting as well.    -Follow-up with your outpatient psychiatric provider -instructions on appointment date, time, and address (location) are provided to you in discharge paperwork. Recommend NOT continuing the Invega LAI, due to negative symptoms caused by this medication and also the elevated PRL level. I recommend that when the next lai dose is due, the pt either be transitioned to oral invega/risperdal (at a lower dose), or tranisitioned to abilify or a different PRL-neutral mood stabilizer.   -Take your psychiatric medications as prescribed at discharge - instructions are provided to you in the discharge paperwork  -Follow-up with outpatient primary care doctor and other specialists -for management of preventative medicine and any chronic medical disease.  -You are also getting a referral to an endocrinologist for evaluation of your elevated prolactin levels. Please also notify your PCP and outpatient psychiatrist of your elevated prolactin levels.   -Recommend abstinence from alcohol, tobacco, and other illicit drug use at discharge.   -If your psychiatric symptoms recur, worsen, or if you have side effects to your psychiatric medications, call your outpatient psychiatric provider, 911, [phone] or go to the nearest emergency department.  -If suicidal thoughts occur, call your outpatient psychiatric provider, 911, 988 or go to the nearest emergency department.   Signed: Peterson Ao, MD 09/14/2023, 12:12 PM

## 2023-09-14 NOTE — BH IP Treatment Plan (Signed)
Interdisciplinary Treatment and Diagnostic Plan Update  09/14/2023 Time of Session: 9:05am (UPDATE) Cynai Skeens MRN: [id number]  Principal Diagnosis: Bipolar 1 disorder, depressed (HCC)  Secondary Diagnoses: Principal Problem:   Bipolar 1 disorder, depressed (HCC)   Current Medications:  Current Facility-Administered Medications  Medication Dose Route Frequency Provider Last Rate Last Admin   acetaminophen (TYLENOL) tablet 650 mg  650 mg Oral Q6H PRN Eligha Bridegroom, NP       alum & mag hydroxide-simeth (MAALOX/MYLANTA) 200-200-20 MG/5ML suspension 30 mL  30 mL Oral Q4H PRN Eligha Bridegroom, NP       amantadine (SYMMETREL) 50 MG/5ML solution 50 mg  50 mg Oral Q12H Massengill, Harrold Donath, MD   50 mg at 09/14/23 0755   buPROPion (WELLBUTRIN XL) 24 hr tablet 150 mg  150 mg Oral Daily Massengill, Harrold Donath, MD   150 mg at 09/14/23 0755   haloperidol (HALDOL) tablet 5 mg  5 mg Oral TID PRN Eligha Bridegroom, NP       Or   haloperidol lactate (HALDOL) injection 5 mg  5 mg Intramuscular TID PRN Eligha Bridegroom, NP       hydrOXYzine (ATARAX) tablet 25 mg  25 mg Oral TID PRN Eligha Bridegroom, NP   25 mg at 09/08/23 2112   LORazepam (ATIVAN) tablet 2 mg  2 mg Oral TID PRN Eligha Bridegroom, NP       Or   LORazepam (ATIVAN) injection 2 mg  2 mg Intramuscular TID PRN Eligha Bridegroom, NP       magnesium hydroxide (MILK OF MAGNESIA) suspension 30 mL  30 mL Oral Daily PRN Eligha Bridegroom, NP       traZODone (DESYREL) tablet 50 mg  50 mg Oral QHS,MR X 1 McQuilla, Jai B, MD   50 mg at 09/12/23 2150   PTA Medications: Medications Prior to Admission  Medication Sig Dispense Refill Last Dose   PRESCRIPTION MEDICATION Inject into the skin every 3 (three) months. Medication: Invega injection       Patient Stressors: Other: Lack of motivation and increased depression    Patient Strengths: Capable of independent living  General fund of knowledge   Treatment Modalities: Medication Management,  Group therapy, Case management,  1 to 1 session with clinician, Psychoeducation, Recreational therapy.   Physician Treatment Plan for Primary Diagnosis: Bipolar 1 disorder, depressed (HCC) Long Term Goal(s):     Short Term Goals:    Medication Management: Evaluate patient's response, side effects, and tolerance of medication regimen.  Therapeutic Interventions: 1 to 1 sessions, Unit Group sessions and Medication administration.  Evaluation of Outcomes: Progressing  Physician Treatment Plan for Secondary Diagnosis: Principal Problem:   Bipolar 1 disorder, depressed (HCC)  Long Term Goal(s):     Short Term Goals:       Medication Management: Evaluate patient's response, side effects, and tolerance of medication regimen.  Therapeutic Interventions: 1 to 1 sessions, Unit Group sessions and Medication administration.  Evaluation of Outcomes: Progressing   RN Treatment Plan for Primary Diagnosis: Bipolar 1 disorder, depressed (HCC) Long Term Goal(s): Knowledge of disease and therapeutic regimen to maintain health will improve  Short Term Goals: Ability to remain free from injury will improve, Ability to verbalize frustration and anger appropriately will improve, Ability to participate in decision making will improve, Ability to verbalize feelings will improve, Ability to identify and develop effective coping behaviors will improve, and Compliance with prescribed medications will improve  Medication Management: RN will administer medications as ordered by provider, will assess and evaluate  patient's response and provide education to patient for prescribed medication. RN will report any adverse and/or side effects to prescribing provider.  Therapeutic Interventions: 1 on 1 counseling sessions, Psychoeducation, Medication administration, Evaluate responses to treatment, Monitor vital signs and CBGs as ordered, Perform/monitor CIWA, COWS, AIMS and Fall Risk screenings as ordered, Perform  wound care treatments as ordered.  Evaluation of Outcomes: Progressing   LCSW Treatment Plan for Primary Diagnosis: Bipolar 1 disorder, depressed (HCC) Long Term Goal(s): Safe transition to appropriate next level of care at discharge, Engage patient in therapeutic group addressing interpersonal concerns.  Short Term Goals: Engage patient in aftercare planning with referrals and resources, Increase social support, Increase emotional regulation, Facilitate acceptance of mental health diagnosis and concerns, Identify triggers associated with mental health/substance abuse issues, and Increase skills for wellness and recovery  Therapeutic Interventions: Assess for all discharge needs, 1 to 1 time with Social worker, Explore available resources and support systems, Assess for adequacy in community support network, Educate family and significant other(s) on suicide prevention, Complete Psychosocial Assessment, Interpersonal group therapy.  Evaluation of Outcomes: Progressing   Progress in Treatment: Attending groups: Yes. Participating in groups: Yes. Taking medication as prescribed: Yes. Toleration medication: Yes. Family/Significant other contact made: yes, have contacted:  [name] (husband) [phone] Patient understands diagnosis: Yes. Discussing patient identified problems/goals with staff: Yes. Medical problems stabilized or resolved: Yes. Denies suicidal/homicidal ideation: Yes. Issues/concerns per patient self-inventory: Yes.   New problem(s) identified: No, Describe:  None   New Short Term/Long Term Goal(s):medication stabilization, elimination of SI thoughts, development of comprehensive mental wellness plan.      Patient Goals:  Medication Stabilization   Discharge Plan or Barriers: Patient recently admitted. CSW will continue to follow and assess for appropriate referrals and possible discharge planning.      Reason for Continuation of Hospitalization:  Anxiety Depression Mania Medication stabilization Suicidal ideation   Estimated Length of Stay: 5-7 Days  Last 3 [place] Suicide Severity Risk Score: Flowsheet Row [facility] (Current[facility] Most recent reading at 09/08/2023  4:00 PM ED from 09/07/2023 in [facility] at [facility] Most recent reading at 09/08/2023  3:05 AM ED from 09/07/2023 in [facility] Most recent reading at 09/07/2023  2:47 PM  C-SSRS RISK CATEGORY Error: Q3, 4, or 5 should not be populated when Q2 is No High Risk High Risk       Last PHQ 2/9 Scores:    07/18/2023    4:21 PM 11/05/2021   10:33 AM  Depression screen PHQ 2/9  Decreased Interest 2 0  Down, Depressed, Hopeless 3 0  PHQ - 2 Score 5 0  Altered sleeping 2 0  Tired, decreased energy 2 0  Change in appetite 2 0  Feeling bad or failure about yourself  2 0  Trouble concentrating 1 0  Moving slowly or fidgety/restless 1 1  Suicidal thoughts 0 0  PHQ-9 Score 15 1  Difficult doing work/chores Very difficult Not difficult at all    Scribe for Treatment Team: Izell Derby, LCSW 09/14/2023 9:40 AM

## 2023-09-14 NOTE — [facility] Group Notes (Signed)
Spiritual care group on grief and loss facilitated by Chaplain Dyanne Carrel, [facility]  Group Goal: Support / Education around grief and loss  Members engage in facilitated group support and psycho-social education.  Group Description:  Following introductions and group rules, group members engaged in facilitated group dialogue and support around topic of loss, with particular support around experiences of loss in their lives. Group Identified types of loss (relationships / self / things) and identified patterns, circumstances, and changes that precipitate losses. Reflected on thoughts / feelings around loss, normalized grief responses, and recognized variety in grief experience. Group encouraged individual reflection on safe space and on the coping skills that they are already utilizing.  Group drew on Adlerian / Rogerian and narrative framework  Patient Progress: Marie Rice attended group.  While verbal participation was minimal, Marie Rice remained engaged for the duration of the group.

## 2023-09-14 NOTE — Group Note (Signed)
Date:  09/14/2023 Time:  9:03 AM  Group Topic/Focus:  Goals Group:   The focus of this group is to help patients establish daily goals to achieve during treatment and discuss how the patient can incorporate goal setting into their daily lives to aide in recovery.    Participation Level:  Active  Participation Quality:  Appropriate  Affect:  Appropriate  Cognitive:  Appropriate  Insight: Appropriate  Engagement in Group:  Engaged  Modes of Intervention:  Discussion  Additional Comments:  Goal: To reach discharge by following the treatment plan.   Marie Rice 09/14/2023, 9:03 AM

## 2023-09-14 NOTE — Plan of Care (Signed)
  Problem: Education: Goal: Knowledge of  General Education information/materials will improve Outcome: Progressing Goal: Emotional status will improve Outcome: Progressing Goal: Mental status will improve Outcome: Progressing Goal: Verbalization of understanding the information provided will improve Outcome: Progressing   Problem: Activity: Goal: Interest or engagement in activities will improve Outcome: Progressing Goal: Sleeping patterns will improve Outcome: Progressing   Problem: Coping: Goal: Ability to verbalize frustrations and anger appropriately will improve Outcome: Progressing Goal: Ability to demonstrate self-control will improve Outcome: Progressing   Problem: Health Behavior/Discharge Planning: Goal: Identification of resources available to assist in meeting health care needs will improve Outcome: Progressing Goal: Compliance with treatment plan for underlying cause of condition will improve Outcome: Progressing   Problem: Safety: Goal: Periods of time without injury will increase Outcome: Progressing   Problem: Education: Goal: Knowledge of General Education information will improve Description: Including pain rating scale, medication(s)/side effects and non-pharmacologic comfort measures Outcome: Progressing   Problem: Health Behavior/Discharge Planning: Goal: Ability to manage health-related needs will improve Outcome: Progressing   Problem: Clinical Measurements: Goal: Ability to maintain clinical measurements within normal limits will improve Outcome: Progressing Goal: Will remain free from infection Outcome: Progressing Goal: Diagnostic test results will improve Outcome: Progressing Goal: Respiratory complications will improve Outcome: Progressing Goal: Cardiovascular complication will be avoided Outcome: Progressing

## 2023-09-14 NOTE — Progress Notes (Signed)
   09/14/23 0530  15 Minute Checks  Location Bedroom  Visual Appearance Calm  Behavior Sleeping  Sleep (Behavioral Health Patients Only)  Calculate sleep? (Click Yes once per 24 hr at 0600 safety check) Yes  Documented sleep last 24 hours 7

## 2024-01-14 ENCOUNTER — Other Ambulatory Visit: Payer: Self-pay

## 2024-01-14 DIAGNOSIS — R7989 Other specified abnormal findings of blood chemistry: Secondary | ICD-10-CM

## 2024-01-19 ENCOUNTER — Other Ambulatory Visit: Payer: BC Managed Care – PPO

## 2024-01-26 ENCOUNTER — Ambulatory Visit: Payer: BC Managed Care – PPO | Admitting: "Endocrinology

## 2024-01-26 LAB — INSULIN-LIKE GROWTH FACTOR
IGF-I, LC/MS: 221 ng/mL (ref 53–331)
Z-Score (Female): 1 {STDV} (ref ?–2.0)

## 2024-01-26 LAB — BASIC METABOLIC PANEL
BUN/Creatinine Ratio: 6 (calc) (ref 6–22)
BUN: 5 mg/dL — ABNORMAL LOW (ref 7–25)
CO2: 24 mmol/L (ref 20–32)
Calcium: 9.4 mg/dL (ref 8.6–10.2)
Chloride: 104 mmol/L (ref 98–110)
Creat: 0.82 mg/dL (ref 0.50–0.97)
Glucose, Bld: 89 mg/dL (ref 65–99)
Potassium: 4 mmol/L (ref 3.5–5.3)
Sodium: 138 mmol/L (ref 135–146)

## 2024-01-26 LAB — GROWTH HORMONE: Growth Hormone: 1.1 ng/mL (ref <=7.1)

## 2024-01-26 LAB — T4, FREE: Free T4: 1 ng/dL (ref 0.8–1.8)

## 2024-01-26 LAB — CORTISOL: Cortisol, Plasma: 14.2 ug/dL

## 2024-01-26 LAB — ESTRADIOL: Estradiol: 160 pg/mL

## 2024-01-26 LAB — LUTEINIZING HORMONE: LH: 4.5 m[IU]/mL

## 2024-01-26 LAB — ACTH: C206 ACTH: 30 pg/mL (ref 6–50)

## 2024-01-26 LAB — TSH: TSH: 1.53 m[IU]/L

## 2024-01-26 LAB — FOLLICLE STIMULATING HORMONE: FSH: 2.9 m[IU]/mL

## 2024-01-26 LAB — PROLACTIN: Prolactin: 27.8 ng/mL

## 2024-10-28 NOTE — Anesthesia Procedure Notes (Signed)
 Spinal Block  Patient location during procedure: OR  Universal Protocols: Procedure explained and questions answered to patient or proxy's satisfaction: yes  Relevant documents present and verified: yes Test results available and properly labeled: yes Required blood products, implants, devices, and special equipment available: yes Site/side marked: no   Immediately prior to procedure a time out was called: yes A time out verifies correct patient, procedure, equipment/supplies available, support staff, safety concerns (including but not limited to fire safety, estimated blood loss, appropriate positioning, etc.) and site/side marked as required. Patient identity confirmed: anonymous protocol, patient vented/unresponsive, verbally with patient, arm band and MRN      Staffing Performed: anesthesiologist  Authorized by: Toribio JINNY Creed, MD   Performed by: Toribio JINNY Creed, MD Spinal Block Patient position: sitting Prep: ChloraPrep Patient monitoring: continuous pulse oximetry Approach: midline Location: L3-4 Injection technique: single-shot Sterility prep: cap, drape, gloves, hand hygiene and mask Sedation level: no sedation Needle Needle type: pencil-tip  Needle gauge: 25 G Needle length: 10 cm Additional Notes Easy aspiration of CSF before and after injection  Preanesthetic Checklist Completed: patient identified, IV checked, no site marked, risks and benefits discussed, surgical consent, monitors and equipment checked, pre-op evaluation and timeout performed Reason for block: primary anesthetic Assessment Sensory level: T4 Number of attempts: 1 Procedure assessment: patient tolerated procedure well with no immediate complications  Post-procedure details:  Was the procedure successful? Yes. Post procedure diagnosis: same. Estimated Blood loss: None Specimen Collected? No.

## 2024-10-30 NOTE — Nursing Note (Signed)
 Acknowledgement of AVS Instructions  Discharge instructions have been reviewed with the patient/support person. The patient/support person has been provided a copy of the discharge instructions.  The patient/support person has been given the opportunity for a demonstration of specific follow-up care tasks. Patient aware to make follow-up appointment. Patient aware of signs and symptoms to seek medical care. Patient discharged with infant and spouse.

## 2024-10-30 NOTE — Discharge Summary (Signed)
 [facility]  Obstetrics Discharge Note  Discharge Details  Admit date:         10/28/2024 Discharge date and time:       10/30/2024  Hospital Days:    2 days  Active Hospital Problems   Diagnosis Date Noted POA   *Status post repeat low transverse cesarean section 10/28/2024 Not Applicable   Encounter for maternal care for low transverse scar from repeat cesarean delivery (*) 10/28/2024 Yes   Excessive weight gain 10/06/2024 Yes   History of cesarean section 04/13/2024 Not Applicable   Depression 04/13/2024 Yes   History of postpartum depression 04/13/2024 Not Applicable   Antepartum multigravida of advanced maternal age (*) 04/13/2024 Yes   Asthma (*) 04/13/2024 Yes   Uterine fibroid 04/13/2024 Yes   BMI 25.0-25.9,adult 04/13/2024 Not Applicable   Bipolar affective disorder, current episode manic (*) 06/13/2020 Yes    Resolved Hospital Problems   Diagnosis Date Noted Date Resolved POA   Prenatal care, subsequent pregnancy, antepartum (*) 04/13/2024 10/30/2024 Not Applicable      Current Discharge Medication List     START taking these medications      Details  ferrous sulfate 325 (65 FE) MG tablet  Take one tablet (325 mg dose) by mouth daily. Quantity: 30 tablet   gabapentin 100 mg capsule Commonly known as: NEURONTIN  Take one capsule (100 mg dose) by mouth 3 (three) times a day for 7 days. Quantity: 21 capsule   ibuprofen 600 mg tablet Commonly known as: ADVIL,MOTRIN  Take one tablet (600 mg dose) by mouth every 6 (six) hours as needed for Pain. Quantity: 30 tablet   norethindrone 0.35 MG tablet Commonly known as: ORTHO MICRONOR,CAMILA,NORA-BE,ERRIN,JOLIVETTE,HEATHER,SHAROBEL  Take one tablet (0.35 mg dose) by mouth daily. Quantity: 90 tablet   oxyCODONE  HCl 5 mg immediate release tablet Commonly known as: ROXICODONE   Take one tablet (5 mg dose) by mouth every 4 (four) hours as needed (Pain Level 3-6) for up to 7  days. Max Daily Amount: 30 mg Quantity: 30 tablet       STOP taking these medications    ondansetron  4 mg tablet Commonly known as: ZOFRAN    pantoprazole sodium 20 mg tablet Commonly known as: PROTONIX   PRENATAL 1 PO   promethazine 12.5 MG tablet Commonly known as: [facility] Course  Physicians involved in care during this hospitalization Attending Provider: Krystal JONETTA Deaner, MD Attending Provider: Laveda Furlough, DO Admitting Provider: Laveda Furlough, DO Anesthesiologist: Toribio JINNY Creed, MD  Indication for Admission:  Repeat cesarean section due to history of cesarean section Hospital Course:       No notes on file Marie Rice is a 38 y.o. [id number] who was admitted at [redacted]w[redacted]d for repeat cesarean delivery.  Her antepartum course was complicated by: Hx of LTCS Uterine fibroids History of Bipolar disorder/depression/PPD (no meds currently AMA Asthma LGA Excessive maternal weight gain  She had an unremarkable cesarean delivery. Her postpartum course was complicated by having ruled in for gestational HTN based on mild range blood pressures. She was normotensive prior to discharge home.  She is now PPD#2 and doing well. Her pain is well controlled, especially with use of abdominal binder. She is tolerating a regular diet and ambulating without difficulty. Voiding spontaneously. Vitals wnl, UOP adequate, exam unremarkable. We have reviewed postpartum restrictions and return precautions including pelvic rest until postpartum visit. We reviewed signs of infection, postpartum depression, and abnormal bleeding. She  will have follow up in 6-8 weeks for her postpartum visit. See hospital chart for specific details.  Discharge information:  Maternal Blood Type: O POS , Rhogam not indicated Postpartum Hgb:  Lab Results  Component Value Date   HGB 8.3 (L) 10/29/2024        On oral iron Rubella status: immune Breast and bottle feeding,  female infant Contraception: Desires POPs - discussed taking these within the same 30 minutes daily  Physical exam: Gen: NAD, pleasant and cooperative Cardio: Normal rate Pulm: Normal work of breathing Abd: Soft, non-distended, non-tender throughout, no rebound/guarding, incision dressed with steri strips and without erythema/induration/drainage Ext: Trace bilateral LE edema, no bilateral calf tenderness  Delivery: Date of Birth: 10/28/2024  Time of Birth: 8:54 AM  Steroids administered: No Information for the patient's newborn:  Kundert, NBF Khaylee [[id number]]  female Birth History   Birth    Length: 20 (50.8 cm)    Weight: 4.03 kg (8 lb 14.2 oz)    HC 34.9 cm (13.75)   Apgar    One: 8    Five: 9   Delivery Method: C-Section, Low Transverse   Gestation Age: 39 6/7 wks   Hospital Name: [facility] Location: [place], [place]    Bedside Procedures   No orders found     Procedure(s) (LRB): CESAREAN SECTION (N/A)  10/28/2024  Surgeon(s): Laveda Furlough, DO -------------------    [facility] Care   Activity Instructions     Activity order:  No restrictions     Activity order:  rest often as permitted by your babys feeding/sleeping schedule     Activity order: May return to work     May return to work in 6 weeks   Avoid strenuous lifting, pulling, pushing     For 2 weeks   Gradually increase daily activities until you are back to your normal routine        Diet Instructions     Regular Diet     No restrictions      Other Instructions     Discharge Follow-Up:  call the office for postpartum appointment in 6 weeks     Our office will arrange a 1 week postpartum depression screen, blood pressure check and incision check, and your 6 week postpartum visit.   Discharge instructions: nothing in vagina for 6 weeks     No sex, tampons or douching   Discharge instructions: wash hands after using the  bathroom, before breast feeding and before handling your baby     Do not douche or use tampons for the first six weeks after discharge     If you had a C Section, gently cleanse the area with soap and water daily. Examine your incision daily     Notify  physician of signs of infection     around cesarean incision pain that may be sudden or gradually get stronger around your incision, separation around the edges, very red, increased swelling, 3leaking any fluids from your incision   Notify physician of blurred vision, spots in your vision, or headache not relieved by Tylenol      Notify physician of constipation longer than three days     Notify physician of crying spells or depression that feels out of control     Notify physician of difficulty breathing, dizziness or fainting     Notify physician of fever over 100.4 F     Notify physician of heavy or prolonged bleeding  Notify physician of new or increased drainage, redness, swelling or areas that become tender or have odor from or around the Cesarean incision     Notify physician of pain that becomes worse and not controlled by your pain medication     Notify physician of painful, firm, red area on one breast or with flulike symptoms or cracked bleeding nipples     Notify physician of passing large blood clots     Notify physician of sudden onset of severe Abdominal Pain     Notify physician of urination (peeing) that is painful, burns, is difficult or too frequent     Notify physician vaginal drainage with a bad odor     Notify physicianof worsening lower abdominal pain     Refer to Mother and New Baby Care booklet for further instructions and information      Postpartum Information  Vaginal Delivery:  You bottom may be sore after delivery.  Keeping your bottom clean and dry will aid healing and prevent infection.  Always wipe from front to back after using the restroom, use a peribottle after urination, and use sanitary pads for the  first six weeks instead of tampons.  Do not have intercourse before you are seen for your six week visit.  Cesarean Delivery:  If you have staples, they will be removed approximately 3 days following your surgery and steri-strips will be applied across your incision.  If they dont fall off by themselves, remove them 5 days after they were applied.  If you have sutures, these are underneath the skin and will dissolve on their own over the next few weeks. Gently clean your incision side to side with soap and water.  You may have some numbness along the incision - this is normal.  Always wipe front to back after using the restroom and use sanitary pads for the first six weeks instead of tampons.  Do not have intercourse before you are seen for your six week checkup.  Activity:  At first, reserve your strength to care for your baby and yourself.  GRADUALLY resume your normal activities.  Formal exercising (sit-ups, leg-lifts, etc..) can be started 4 weeks after a vaginal delivery and 6 weeks after a cesarean delivery unless your physician specifies otherwise.  Do not lift or push anything heavier than your baby.  Changes you can expect in your body:  Bleeding may last up to 4-6 weeks.  There should be a gradual decrease in the amount and a change in the color from dark red to clear.  During the first 3-4 days you may have small clots.  On the day of discharge you may notice a slight increase in bleeding due to increased activity. Your uterus (womb) will gradually decrease in size in 4-6 weeks.  You may notice some cramping; breastfeeding moms tend to experience stronger cramping and this is normal. A drop in hormones may cause sweating and flushing; this usually occurs on the second or third day and usually lasts about 24 hours.  You may urinate more frequently as you get rid of excess fluid.  You may also notice swelling (even more so than when you were pregnant) especially in hands, feet and ankles and THIS  WILL go away in a few weeks.  Avoid excessive amounts of salt, drink a lot of water and elevate the swollen areas. During your first week at home, many women may feel vaguely tired, blue or unhappy.  This is called the post partum blues.  It is normal and is due to a sudden change in your hormone levels.  It should only last a few days and should not interfere with your ability to care for your baby or yourself. It may be a few days before you have a bowel movement.  If you have stitches, DO NOT worry about them tearing - they are NOT fragile.  They may also itch while healing and this is normal.  Use a stool softener daily, drink extra water and gradually increase your activity.  Limit narcotic pain relievers like Vicodin and Darvocet because they are constipating.  Metamucil or Citracal with lots of water can also help. If you have gas pains, warm liquids and walking will help.  Avoid ice, drinking icy liquids and using straws. Having a backache is common.  Use Motrin or Advil (both are ok with breastfeeding), heat and massage.  If the backache doesnt go away in 2-3 weeks or if you have leg weakness or numbness call the office.    Breastfeeding: Your milk should come in 3-5 days after delivery with your first baby and earlier with subsequent deliveries.  If you nurse frequently, you are less likely to get engorged Alternate the breast you start with each session, as this will allow them to drain completely It may be uncomfortable for 5-10 seconds after the baby latches on, but after that you should feel a gently tugging.  If it is painful for longer than 10 seconds, gently take the baby off and reposition Look to see if the babys lower lip is rolled up.  Baby should have fish lips when latched properly 90% of nipple pain is due to an improper latch.  After 12-14 days, latching on should no longer be uncomfortable Put the baby to the breast every 1.5-2 hours and on demand if needed. Baby may be  sleepy or uninterested for the first few days.  Encourage BF 8-12 times per day By the 6th day, you baby should have at least 6 wet and 2 dirty diapers per day, if not call your babys pediatrician You must drink A LOT of water to maintain a good milk supply. Continue taking a prenatal vitamin daily  Call the office and speak to one of our nutritionists/lactation consultants if: You feel like youre getting the flu (fever, achy, tired, run-down) Your nipples are cracked, bleeding or you have nipple pain when not nursing You develop a red, painful area on your breast, with or without red streaks  If you are not breastfeeding, wear a tight bra around the clock for 14 days.  For pain:  use ice packs, Tylenol  or Advil as directed on the label, cabbage leaves and avoid heat to the breast.  The breast tenderness and fullness will usually resolve within 24-48 hours.  For information regarding medications taken during breastfeeding call your babys pediatrician.  When to call the office: Fever greater than 101 F Vaginal bleeding heavier than a period Blood clots greater than the size of a quarter Severe pain unrelieved with medication Severe headaches Depression that interferes with daily activities Vaginal itching, burning or odor Incision problems (drainage, redness, or if your incision opens up) Call if you have any questions  If you are having problems with any breastfeeding issues please call our office.  Our dietitians can see you in the office for breastfeeding help and we will file with your insurance company.      Contact information for follow-up     [facility]   [address] [place] [address]  Phone: [phone]     Next Steps: Follow up in 1 week(s)   Instructions: Our office will call to schedule a 1 week postpartum depression screen, incision check, and blood pressure check as well as your 6 week postpartum visit.      Appointments which  have been scheduled    Dec 06, 2024 3:30 PM Postpartum with Laveda Furlough, DO [facility]) (--) [address] [place] [address] [phone]        Electronically signed: Eleanor GORMAN Jury, DO 10/30/2024 / 10:41 AM

## 2024-11-23 NOTE — Progress Notes (Signed)
 "     Patient presents with   Postpartum Care    Csection 10/28/24 by Dr Manuela, Marie Rice, plan for bc- ocp, breast feeding some pain in right breast, vb has stopped but started back      SUBJECTIVE: Marie Rice is a 38 y.o. [id number] who is now 4  weeks postpartum.  Marie's name: Marie Rice; female   Method of delivery: primary cesarean section at 39 weeks 6 days by Dr. Manuela      Problem List[1]   She is breast-feeding and is experiencing problems had some pain in right breast and not producing alot of milk- not seeing lactaion  She has not been sexually active. She plans to use oral contraceptives (estrogen/progesterone) for contraception.   She does have good support at home. She is coping well.   Bowel complaints?: yes - some pain with bowel movements  Bladder complaints?: no   Healed well?: Yes Laceration/tears/repairs? (If yes please add degree & location): no Vaginal bleeding stopped?: Yes, then came back 12/1   Needs Return to Work Note: planning on going back to work 12/22    Last pap smear: May 2025 Nl, HR HPV negative   --------------------------  Problem List[2] Current Medications[3] Past Medical History:  Diagnosis Date   Asthma (*)    Bipolar affective disorder (*)    Depression    Fetal intolerance to labor, delivered, current hospitalization (*) 10/28/2018   Fibroid    Group B streptococcal carriage complicating pregnancy (*) 10/28/2018   Past Surgical History:  Procedure Laterality Date   Cesarean section     2019    Social History   Social History Narrative   Not on file    Postpartum Depression Screening  More data exists      11/23/2024  [facility] AMB POSTPARTUM DEPRESSSION SCALE  I have been able to laugh and see the funny side of things 0  I have looked forward with enjoyment to things 0  I have blamed myself unnecessarily when things went wrong 0  I have been anxious or worried for no good reason 0   I have felt scared or panicky for no good reason 0  I haven't been able to cope lately 0  I have been so unhappy that I have had difficulty sleeping 0  I have felt sad or miserable 0  I have been so unhappy that I have been crying 0  The thought of harming myself has occurred to me 0  Postpartum Depression Total 0  Interpretation Appears ok     OBJECTIVE:  Physical Exam:  BP 120/76   Wt 178 lb 6.4 oz (80.9 kg)   LMP 01/23/2024 (Approximate)   Breastfeeding Yes   BMI 30.62 kg/m  GEN: Well developed, alert and oriented x 3 SKIN: (exposed areas only)    Rashes: No  Lesions: No  Ulcers: No BREASTS: symmetric, normal axillary lymph nodes bilaterally  Right: no nipple discharge, no tenderness, no masses  Left: no nipple discharge, no tenderness, no masses ABDOMEN: Non-tender, no hernia or masses.   Normal liver and spleen. INCISION: well-healed GENITOURINARY EXAM:  EXT GENITALIA: normal external genitalia, no erythema, no discharge, small external hemorrhoids x2  URETHRA / URETHRAL MEATUS: WNL  VAGINA: normal appearing vagina with normal color and discharge, no lesions  CERVIX: normal appearing cervix without discharge or lesions  UTERUS: normal size, well-involuted  Left ADNEXA: Normal, No masses, nodularity, tenderness  Right ADNEXA: Normal, No masses, nodularity, tenderness  ASSESSMENT:  normal postpartum exam normal post-operative exam patient is a candidate for subdermal birth control implant for contraception, with no contraindications able to resume normal activities in 2 weeks   PLAN:  - I have fully reviewed the prenatal and intrapartum course. -Contraception: Discussion had regarding all options.  Pt prefers subdermal birth control implant. -Pt may resume normal physical activity. -Pt may resume intercourse. -FU in 12 months for annual exam.  -FU in 2 weeks for nexplanon insertion, patient desires more time to consider, Discussed and given information on  nexplanon as well as IUD. She desiresLACRC and leaning towards nexplanon.    1. Postpartum state (*)   2. Breast feeding status of mother (*)   3. Pain aggravated by breast feeding (*)   4. S/P C-section   5. Hemorrhoids, unspecified hemorrhoid type     Orders Placed This Encounter  Procedures   Ambulatory referral to Lactation    Standing Status:   Future    Expiration Date:   11/23/2025    Referral Priority:   Routine    Referral Type:   Consultation    Referral Reason:   Evaluate and Return    Requested Specialty:   Lactation Services    Number of Visits Requested:   1    Expiration Date:   05/21/2025      Patient's Medications       * Accurate as of November 23, 2024  2:21 PM. Reflects encounter med changes as of last refresh          New Prescriptions      Instructions  hydrocortisone 2.5% rectal cream Commonly known as: ANUSOL-HC Started by: Marie Ogunbekun, DO  Apply rectally 2 times daily       Continued Medications      Instructions  ferrous sulfate 325 (65 FE) MG tablet  325 mg, Oral, Daily   furosemide 20 mg tablet Commonly known as: LASIX  20 mg, Oral, Daily   gabapentin 100 mg capsule Commonly known as: NEURONTIN  100 mg, Oral, 3 times a day   ibuprofen 600 mg tablet Commonly known as: ADVIL,MOTRIN  600 mg, Oral, Every 6 hours as needed   norethindrone 0.35 MG tablet Commonly known as: ORTHO MICRONOR,CAMILA,NORA-BE,ERRIN,JOLIVETTE,HEATHER,SHAROBEL  0.35 mg, Oral, Daily        Follow up in about 2 weeks (around 12/07/2024) for Nexplanon placement.   Marie MYRTIS Furlough, DO 11/23/2024 2:21 PM       [1] Patient Active Problem List Diagnosis   Bipolar affective disorder, current episode manic (*)   Depression   History of postpartum depression   Antepartum multigravida of advanced maternal age (*)   Uterine fibroid   History of cesarean section   Asthma (*)   BMI 25.0-25.9,adult   Nausea and vomiting in  pregnancy (*)   Excessive weight gain   Encounter for maternal care for low transverse scar from repeat cesarean delivery (*)   Status post repeat low transverse cesarean section  [2] Patient Active Problem List Diagnosis   Bipolar affective disorder, current episode manic (*)   Depression   History of postpartum depression   Antepartum multigravida of advanced maternal age (*)   Uterine fibroid   History of cesarean section   Asthma (*)   BMI 25.0-25.9,adult   Nausea and vomiting in pregnancy (*)   Excessive weight gain   Encounter for maternal care for low transverse scar from repeat cesarean delivery (*)   Status post repeat low transverse cesarean section  [  3] Current Outpatient Medications  Medication Sig Dispense Refill   ferrous sulfate 325 (65 FE) MG tablet Take one tablet (325 mg dose) by mouth daily. 30 tablet 3   furosemide (LASIX) 20 mg tablet Take one tablet (20 mg dose) by mouth daily for 3 days. 3 tablet 0   gabapentin (NEURONTIN) 100 mg capsule Take one capsule (100 mg dose) by mouth 3 (three) times a day for 7 days. (Patient not taking: Reported on 11/14/2024) 21 capsule 0   hydrocortisone (ANUSOL-HC) 2.5% rectal cream Apply rectally 2 times daily 30 g 1   ibuprofen (ADVIL,MOTRIN) 600 mg tablet Take one tablet (600 mg dose) by mouth every 6 (six) hours as needed for Pain. 30 tablet 1   norethindrone (ORTHO MICRONOR,CAMILA,NORA-BE,ERRIN,JOLIVETTE,HEATHER,SHAROBEL) 0.35 MG tablet Take one tablet (0.35 mg dose) by mouth daily. 90 tablet 4   No current facility-administered medications for this visit.  *Some images could not be shown."

## 2024-12-06 NOTE — Progress Notes (Signed)
 Marie Rice is here for Nexplanon insertion.  The current method of family planning is abstinence.    She has no complaint today.  Patient's last menstrual period was Patient's last menstrual period was 01/23/2024 (approximate).  Review of Systems - General ROS: negative Psychological ROS: negative  Genito-Urinary ROS: no dysuria, trouble voiding, or hematuria Neurological ROS: negative  Informed consent was obtained today for Implanon insertion, with patient counseled in details re: indications, risks/benefits, and alternatives.  Specifically, patient is counseled re: the increased risk of irregular bleeding on Nexplanon.  Patient understands, all questions answered.    Patient correctly identified by name and DOB. Written consent with provider and patient signatures obtained. Relevant test results available. Purposeful time-out performed by all individuals in room--correct patient, correct procedure, correct site. Patient agreeable to proceed with procedure.  The left upper arm is used as location for Nexplanon insertion.  The area is prepped with betadine.  Local anesthesia is administered using approximately 4cc of 1% lidocaine w/ epinephrine. Placed approximately 8 cm from medial humerus epicondyle below bicipital groove.  Nexplanon was sterilely inserted subcuticularly without difficulty. The implant was easily palpated by both physician and patient. Hemostasis noted.  Incision was steri stripped and pressure dressings applied.  Patient tolerated procedure well without complication.  Postop instructions given.  F/u prn or annually  1. Nexplanon insertion   2. Preprocedural examination     Orders Placed This Encounter  Procedures   POCT urine pregnancy test      Patient's Medications       * Accurate as of December 06, 2024  3:38 PM. Reflects encounter med changes as of last refresh          Continued Medications      Instructions  ferrous sulfate  325 (65 FE) MG tablet  325 mg, Oral, Daily   furosemide 20 mg tablet Commonly known as: LASIX  20 mg, Oral, Daily   gabapentin 100 mg capsule Commonly known as: NEURONTIN  100 mg, Oral, 3 times a day   hydrocortisone 2.5% rectal cream Commonly known as: ANUSOL-HC  Apply rectally 2 times daily   ibuprofen 600 mg tablet Commonly known as: ADVIL,MOTRIN  600 mg, Oral, Every 6 hours as needed   norethindrone 0.35 MG tablet Commonly known as: ORTHO MICRONOR,CAMILA,NORA-BE,ERRIN,JOLIVETTE,HEATHER,SHAROBEL  0.35 mg, Oral, Daily        Follow up in about 1 year (around 12/06/2025) for Annual Gynecology Exam.  Marie MYRTIS Furlough, DO 12/06/2024 3:38 PM  *Some images could not be shown.

## 2024-12-25 ENCOUNTER — Other Ambulatory Visit: Payer: Self-pay

## 2024-12-25 ENCOUNTER — Ambulatory Visit (HOSPITAL_COMMUNITY)
Admission: EM | Admit: 2024-12-25 | Discharge: 2024-12-26 | Disposition: A | Discharge status: Other Acute Inpatient Hospital | Attending: Family | Admitting: Family

## 2024-12-25 ENCOUNTER — Inpatient Hospital Stay (HOSPITAL_COMMUNITY): Admission: AD | Admit: 2024-12-25 | Source: Intra-hospital

## 2024-12-25 DIAGNOSIS — Z91199 Patient's noncompliance with other medical treatment and regimen due to unspecified reason: Secondary | ICD-10-CM | POA: Insufficient documentation

## 2024-12-25 DIAGNOSIS — R4586 Emotional lability: Secondary | ICD-10-CM | POA: Diagnosis not present

## 2024-12-25 DIAGNOSIS — I517 Cardiomegaly: Secondary | ICD-10-CM

## 2024-12-25 DIAGNOSIS — F319 Bipolar disorder, unspecified: Secondary | ICD-10-CM | POA: Diagnosis not present

## 2024-12-25 DIAGNOSIS — Z79899 Other long term (current) drug therapy: Secondary | ICD-10-CM | POA: Insufficient documentation

## 2024-12-25 DIAGNOSIS — F3130 Bipolar disorder, current episode depressed, mild or moderate severity, unspecified: Secondary | ICD-10-CM | POA: Insufficient documentation

## 2024-12-25 DIAGNOSIS — R9431 Abnormal electrocardiogram [ECG] [EKG]: Secondary | ICD-10-CM

## 2024-12-25 LAB — CBC WITH DIFFERENTIAL/PLATELET
Abs Immature Granulocytes: 0.02 K/uL (ref 0.00–0.07)
Basophils Absolute: 0 K/uL (ref 0.0–0.1)
Basophils Relative: 0 %
Eosinophils Absolute: 0.1 K/uL (ref 0.0–0.5)
Eosinophils Relative: 1 %
HCT: 44.7 % (ref 36.0–46.0)
Hemoglobin: 14.1 g/dL (ref 12.0–15.0)
Immature Granulocytes: 0 %
Lymphocytes Relative: 22 %
Lymphs Abs: 1.8 K/uL (ref 0.7–4.0)
MCH: 27.4 pg (ref 26.0–34.0)
MCHC: 31.5 g/dL (ref 30.0–36.0)
MCV: 87 fL (ref 80.0–100.0)
Monocytes Absolute: 0.4 K/uL (ref 0.1–1.0)
Monocytes Relative: 5 %
Neutro Abs: 5.8 K/uL (ref 1.7–7.7)
Neutrophils Relative %: 72 %
Platelets: 381 K/uL (ref 150–400)
RBC: 5.14 MIL/uL — ABNORMAL HIGH (ref 3.87–5.11)
RDW: 15.7 % — ABNORMAL HIGH (ref 11.5–15.5)
WBC: 8 K/uL (ref 4.0–10.5)
nRBC: 0 % (ref 0.0–0.2)

## 2024-12-25 LAB — LIPID PANEL
Cholesterol: 178 mg/dL (ref 0–200)
HDL: 51 mg/dL
LDL Cholesterol: 113 mg/dL — ABNORMAL HIGH (ref 0–99)
Total CHOL/HDL Ratio: 3.5 ratio
Triglycerides: 72 mg/dL
VLDL: 14 mg/dL (ref 0–40)

## 2024-12-25 LAB — POCT URINE DRUG SCREEN - MANUAL ENTRY (I-SCREEN)
POC Amphetamine UR: NOT DETECTED
POC Buprenorphine (BUP): NOT DETECTED
POC Cocaine UR: NOT DETECTED
POC Marijuana UR: NOT DETECTED
POC Methadone UR: NOT DETECTED
POC Methamphetamine UR: NOT DETECTED
POC Morphine: NOT DETECTED
POC Oxazepam (BZO): NOT DETECTED
POC Oxycodone UR: NOT DETECTED
POC Secobarbital (BAR): NOT DETECTED

## 2024-12-25 LAB — COMPREHENSIVE METABOLIC PANEL WITH GFR
ALT: 107 U/L — ABNORMAL HIGH (ref 0–44)
AST: 47 U/L — ABNORMAL HIGH (ref 15–41)
Albumin: 4.9 g/dL (ref 3.5–5.0)
Alkaline Phosphatase: 210 U/L — ABNORMAL HIGH (ref 38–126)
Anion gap: 16 — ABNORMAL HIGH (ref 5–15)
BUN: 7 mg/dL (ref 6–20)
CO2: 22 mmol/L (ref 22–32)
Calcium: 10.3 mg/dL (ref 8.9–10.3)
Chloride: 104 mmol/L (ref 98–111)
Creatinine, Ser: 0.81 mg/dL (ref 0.44–1.00)
GFR, Estimated: >60 mL/min
Glucose, Bld: 77 mg/dL (ref 70–99)
Potassium: 3.5 mmol/L (ref 3.5–5.1)
Sodium: 143 mmol/L (ref 135–145)
Total Bilirubin: 0.8 mg/dL (ref 0.0–1.2)
Total Protein: 8.8 g/dL — ABNORMAL HIGH (ref 6.5–8.1)

## 2024-12-25 LAB — HEMOGLOBIN A1C
Hgb A1c MFr Bld: 5.3 % (ref 4.8–5.6)
Mean Plasma Glucose: 105.41 mg/dL

## 2024-12-25 LAB — ETHANOL: Alcohol, Ethyl (B): <15 mg/dL

## 2024-12-25 LAB — TSH: TSH: 1.98 u[IU]/mL (ref 0.350–4.500)

## 2024-12-25 LAB — SARS CORONAVIRUS 2 BY RT PCR: SARS Coronavirus 2 by RT PCR: NEGATIVE

## 2024-12-25 MED ORDER — LORAZEPAM 2 MG/ML IJ SOLN: 2.0000 mg | Freq: Three times a day (TID) | INTRAMUSCULAR | Status: DC | PRN

## 2024-12-25 MED ORDER — RISPERIDONE 1 MG PO TABS
1.0000 mg | ORAL_TABLET | Freq: Two times a day (BID) | ORAL | Status: DC
  Administered 2024-12-25 – 2024-12-26 (×3): 1 mg via ORAL
  Filled 2024-12-25 (×3): qty 1

## 2024-12-25 MED ORDER — HYDROXYZINE HCL 25 MG PO TABS: 25.0000 mg | ORAL_TABLET | Freq: Three times a day (TID) | ORAL | Status: DC | PRN

## 2024-12-25 MED ORDER — DIPHENHYDRAMINE HCL 50 MG/ML IJ SOLN: 50.0000 mg | Freq: Three times a day (TID) | INTRAMUSCULAR | Status: DC | PRN

## 2024-12-25 MED ORDER — HALOPERIDOL LACTATE 5 MG/ML IJ SOLN: 10.0000 mg | Freq: Three times a day (TID) | INTRAMUSCULAR | Status: DC | PRN

## 2024-12-25 MED ORDER — HALOPERIDOL 5 MG PO TABS: 5.0000 mg | ORAL_TABLET | Freq: Three times a day (TID) | ORAL | Status: DC | PRN

## 2024-12-25 MED ORDER — DIPHENHYDRAMINE HCL 50 MG PO CAPS: 50.0000 mg | ORAL_CAPSULE | Freq: Three times a day (TID) | ORAL | Status: DC | PRN

## 2024-12-25 MED ORDER — ACETAMINOPHEN 325 MG PO TABS: 650.0000 mg | ORAL_TABLET | Freq: Four times a day (QID) | ORAL | Status: DC | PRN

## 2024-12-25 MED ORDER — HALOPERIDOL LACTATE 5 MG/ML IJ SOLN: 5.0000 mg | Freq: Three times a day (TID) | INTRAMUSCULAR | Status: DC | PRN

## 2024-12-25 MED ORDER — MAGNESIUM HYDROXIDE 400 MG/5ML PO SUSP: 30.0000 mL | Freq: Every day | ORAL | Status: DC | PRN

## 2024-12-25 MED ORDER — ALUM & MAG HYDROXIDE-SIMETH 200-200-20 MG/5ML PO SUSP: 30.0000 mL | ORAL | Status: DC | PRN

## 2024-12-25 NOTE — ED Notes (Signed)
 Patient is resting in bed with eyes closed at this time, no distress noted. Will continue to monitor safety

## 2024-12-25 NOTE — ED Notes (Signed)
 Patient admitted to [facility] for further evaluation and medication management.  Patient is 6 months post partum and has been off her meds for bipolar through the pregnancy.  Patient has been exhibiting symptoms of delay in thought and speech, thought blocking with difficulty expressing herself and disorientation.  Patient needed instructions repeated several times in order to complete task.  Patient was calm and cooperative however.  She was given Risperdal  and accepted it without resistance.  She has a bed at [facility] and will go later this evening.  Patient signed voluntary and the plan was discussed with her.  She agreed to go to hospital.  Will monitor.

## 2024-12-25 NOTE — ED Provider Notes (Addendum)
 [facility] Continuous Assessment Admission H&P  Date: 12/25/2024 Patient Name: Marie Rice MRN: [id number] Chief Complaint:  Ottie stated that  I need to be restarted on my meds.  Diagnoses:  Final diagnoses:  Bipolar 1 disorder, depressed (HCC)    HPI: Marie Rice 39 year old female presents to [facility] urgent care accompanied by her husband and children. ( 56 month old and 72 year old daughters).  Marie Rice reports psychiatric diagnosis related to bipolar disorder.  Initially, inquired about restarting medications for  mood swings.  It's Vraylar or something.  This provider attempted to complete assessment patient with state  I cannot remember.  Patient asked if her husband could answer questions for her.  Per patient's husband's,  he reports  Marie Rice was recently seen and evaluated at a local emergency department [facility]  and stated that they were told that she needed to follow-up with psychiatry services.  Reports  that paitent's has an appointment with [facility] psychiat[facility] on  [address] on 12/26/2024. Patient's husband states patient has been  talking to herself, experiencing mood lability and flighty.  Patient's Husband stated that patient hasn't taken medication in 9 months due to pregnancy. Husband denied that she is currently breastfeeding. He reported that ' we can't wait until tomorrow.  Marie Rice presents irritable and paranoid throughout this assessment.  She is denying any suicidal or homicidal ideations.  Denies auditory or visual hallucinations.  She reports a good appetite.  States she is resting well throughout the night.  Chart reviewed patient's previously prescribed Wellbutrin , Risperdal  previously started on long-acting however, it was reported that she has been noncompliant.   Marie Rice is pacing throughout assessment very minimal; she is alert/oriented x 3; calm and recurring a lot of reassurance; and mood congruent with  affect.  Patient is speaking in a clear tone at moderate volume, and normal pace; with fair eye contact.  Patient denies suicidal/self-harm/homicidal ideation, psychosis, and paranoia. Patient responses are short and abrupt.  Patient has remained calm throughout assessment and has answered questions appropriately.    Total Time spent with patient: 15 minutes  Musculoskeletal  Strength & Muscle Tone: within normal limits Gait & Station: normal Patient leans: N/A  Psychiatric Specialty Exam  Presentation General Appearance: Disheveled  Eye Contact:Minimal  Speech:Clear and Coherent  Speech Volume:Normal  Handedness:Right   Mood and Affect  Mood:Anxious; Irritable; Labile  Affect:Labile   Thought Process  Thought Processes:Linear  Descriptions of Associations:Intact  Orientation:Full (Time, Place and Person)  Thought Content:Paranoid Ideation  Diagnosis of Schizophrenia or Schizoaffective disorder in past: No  Duration of Psychotic Symptoms: N/A  Hallucinations:Hallucinations: None  Ideas of Reference:None  Suicidal Thoughts:Suicidal Thoughts: No  Homicidal Thoughts:Homicidal Thoughts: No   Sensorium  Memory:Immediate Fair; Remote Good  Judgment:Good  Insight:Fair   Executive Functions  Concentration:Good  Attention Span:Good  Recall:Fair  Fund of Knowledge:Good  Language:Good   Psychomotor Activity  Psychomotor Activity:Psychomotor Activity: Normal   Assets  Assets:Communication Skills; Desire for Improvement   Sleep  Sleep:Sleep: Fair   Nutritional Assessment (For OBS and [facility]BC admissions only) Has the patient had a weight loss or gain of 10 pounds or more in the last 3 months?: No Has the patient had a decrease in food intake/or appetite?: No Does the patient have dental problems?: No Does the patient have eating habits or behaviors that may be indicators of an eating disorder including binging or inducing vomiting?: No Has the  patient recently lost weight without trying?: 0 Has the  patient been eating poorly because of a decreased appetite?: 0 Malnutrition Screening Tool Score: 0    Physical Exam Vitals and nursing note reviewed.  Neurological:     Mental Status: She is oriented to person, place, and time.  Psychiatric:        Mood and Affect: Mood normal.        Behavior: Behavior normal.        Thought Content: Thought content normal.    ROS  Blood pressure (!) 135/92, pulse 100, temperature 98.9 F (37.2 C), temperature source Oral, resp. rate 18, SpO2 100%. There is no height or weight on file to calculate BMI.  Past Psychiatric History: Previous inpatient admissions.  Hospital[place] in [place] per chart review  Is the patient at risk to self? No  Has the patient been a risk to self in the past 6 months? No .    Has the patient been a risk to self within the distant past? No   Is the patient a risk to others? No   Has the patient been a risk to others in the past 6 months? No   Has the patient been a risk to others within the distant past? No   Past Medical History: Bipolar disorder, Invega long-acting  Family History:   Social History:   Last Labs:  No visits with results within 6 Month(s) from this visit.  Latest known visit with results is:  Orders Only on 01/14/2024  Component Date Value Ref Range Status   C206 ACTH  01/19/2024 30  6 - 50 pg/mL Final   Comment: . Reference range applies only to specimens collected between 7am-10am. .    Cortisol, Plasma 01/19/2024 14.2  mcg/dL Final   Comment: Reference Range: For 8 a.m.(7-9 a.m.) Specimen: 4.0-22.0 Reference Range: For 4 p.m.(3-5 p.m.) Specimen: 3.0-17.0   * Please interpret above results accordingly * .    Prolactin 01/19/2024 27.8  ng/mL Final   Comment:             Reference Range  Females         Non-pregnant        3.0-30.0         Pregnant           10.0-209.0         Postmenopausal       2.0-20.0 . . .    LH 01/19/2024 4.5  mIU/mL Final   Comment:     Reference Range Follicular Phase  1.9-12.5 Mid-Cycle Peak    8.7-76.3 Luteal Phase      0.5-16.9 Postmenopausal    10.0-54.7    FSH 01/19/2024 2.9  mIU/mL Final   Comment:                     Reference Range .              Follicular Phase       2.5-10.2              Mid-cycle Peak         3.1-17.7              Luteal Phase           1.5- 9.1              Postmenopausal       23.0-116.3              .    Estradiol  01/19/2024 160  pg/mL Final   Comment:       Reference Range         Follicular Phase:    19-144         Mid-Cycle:           64-357         Luteal Phase:        56-214         Postmenopausal:      < or = 31 . Reference range established on post-pubertal patient population. No pre-pubertal reference range established using this assay. For any patients for whom low Estradiol  levels are anticipated (e.g. males, pre-pubertal children and hypogonadal/post-menopausal  females), the [employer] Estradiol , Ultrasensitive, LCMSMS assay is recommended (order code 69710). . Please note: patients being treated with the drug  fulvestrant (Faslodex(R)) have demonstrated significant  interference in immunoassay methods for estradiol   measurement. The cross reactivity could lead to falsely  elevated estradiol  test results leading to an  inappropriate clinical assessment of estrogen status. [facility] order code 30289-Estradiol ,  Ultrasensitive LC/MS/MS demonstrates negligible cross  re                          activity with fulvestrant.    Free T4 01/19/2024 1.0  0.8 - 1.8 ng/dL Final   TSH 97/95/7974 1.53  mIU/L Final   Comment:           Reference Range .           > or = 20 Years  0.40-4.50 .                Pregnancy Ranges           First trimester    0.26-2.66           Second trimester   0.55-2.73           Third trimester    0.43-2.91    Growth Hormone  01/19/2024 1.1  < OR = 7.1 ng/mL Final   Comment: . Because of a pulsatile secretion pattern, random (unstimulated) growth hormone (GH) levels are  frequently undetectable in normal children and adults and are not reliable for diagnosing GH deficiency. Regarding suppression tests, failure to suppress GH is diagnostic of acromegaly. . Typical GH response in healthy subjects:   Using the glucose tolerance (GH suppression) test,   acromegaly is ruled out if the patient's GH level    is <1.0 ng/mL at any point in the timed sequence.   [Katznelson L, Laws Jr ER, Melmed S, et al.   Acromegaly: an Endocrine Society Clinical Practice   Guideline. J Clin Endocrinol Metab 2014; 99: 3933-   3951].   Using [facility] stimulation testing, the following result   at any point in the timed sequence makes GH   deficiency unlikely:    Adults (> or = 20 years):       Insulin  Hypoglycemia  > or = 5.1 ng/mL       Arginine/GHRH         > or = 4.1 ng/mL       Glucagon              > or = 3.0 ng/mL    Children (< 20 years):       All Stimulat  ion Tests > or = 10.0 ng/mL    IGF-I, LC/MS 01/19/2024 221  53 - 331 ng/mL Final   Z-Score (Female) 01/19/2024 1.0  -2.0 - 2.0 SD Final   Comment: . This test was developed and its analytical performance characteristics have been determined by [employer]. It has not been cleared or approved by FDA. This assay has been validated pursuant to the CLIA regulations and is used for clinical purposes. .    Glucose, Bld 01/19/2024 89  65 - 99 mg/dL Final   Comment: .            Fasting reference interval .    BUN 01/19/2024 5 (L)  7 - 25 mg/dL Final   Creat 97/95/7974 0.82  0.50 - 0.97 mg/dL Final   BUN/Creatinine Ratio 01/19/2024 6  6 - 22 (calc) Final   Sodium 01/19/2024 138  135 - 146 mmol/L Final   Potassium 01/19/2024 4.0  3.5 - 5.3 mmol/L Final   Chloride 01/19/2024 104  98 - 110 mmol/L Final   CO2 01/19/2024 24  20 - 32 mmol/L  Final   Calcium 01/19/2024 9.4  8.6 - 10.2 mg/dL Final    Allergies: Patient has no known allergies.  Medications:  Facility Ordered Medications  Medication   acetaminophen  (TYLENOL ) tablet 650 mg   alum & mag hydroxide-simeth (MAALOX/MYLANTA) 200-200-20 MG/5ML suspension 30 mL   magnesium  hydroxide (MILK OF MAGNESIA) suspension 30 mL   haloperidol  (HALDOL ) tablet 5 mg   And   diphenhydrAMINE  (BENADRYL ) capsule 50 mg   haloperidol  lactate (HALDOL ) injection 5 mg   And   diphenhydrAMINE  (BENADRYL ) injection 50 mg   And   LORazepam  (ATIVAN ) injection 2 mg   haloperidol  lactate (HALDOL ) injection 10 mg   And   diphenhydrAMINE  (BENADRYL ) injection 50 mg   And   LORazepam  (ATIVAN ) injection 2 mg   hydrOXYzine  (ATARAX ) tablet 25 mg   risperiDONE  (RISPERDAL ) tablet 1 mg   PTA Medications  Medication Sig   buPROPion  (WELLBUTRIN  XL) 150 MG 24 hr tablet Take 1 tablet (150 mg total) by mouth daily.   hydrOXYzine  (ATARAX ) 25 MG tablet Take 1 tablet (25 mg total) by mouth 3 (three) times daily as needed for anxiety.   amantadine  (SYMMETREL ) 50 MG/5ML solution Take 5 mLs (50 mg total) by mouth every 12 (twelve) hours.   traZODone  (DESYREL ) 50 MG tablet Take 1 tablet (50 mg total) by mouth at bedtime as needed for sleep.      Medical Decision Making  Inpatient admission -Restarted Risperdal  2 mg BID  -See chart for agitation orders    Recommendations  Based on my evaluation the patient does not appear to have an emergency medical condition.  Staci LOISE Kerns, NP 12/25/2024  1:18 PM

## 2024-12-25 NOTE — Progress Notes (Signed)
" °   12/25/24 1217  [facility] Triage Screening (Walk-ins at [facility] only)  How Did You Hear About Us ? Self  What Is the Reason for Your Visit/Call Today? Marie Rice is a 82Y female presenting to [facility] as a vol walk-in with her husband and children. Pt states she is here today because she needs medication for her bipolar diagnosis. Pt states she has been without her medicine for over a month and has been having mood swings. Pt denies SI, HI, AVH, and substance use.  How Long Has This Been Causing You Problems? 1-6 months  Have You Recently Had Any Thoughts About Hurting Yourself? No  Are You Planning to Commit Suicide/Harm Yourself At This time? No  Have you Recently Had Thoughts About Hurting Someone Sherral? No  Are You Planning To Harm Someone At This Time? No  Physical Abuse Denies  Verbal Abuse Denies  Sexual Abuse Denies  Exploitation of patient/patient's resources Denies  Are you currently experiencing any auditory, visual or other hallucinations? No  Have You Used Any Alcohol or Drugs in the Past 24 Hours? No  Do you have any current medical co-morbidities that require immediate attention? No  What Do You Feel Would Help You the Most Today? Medication(s)  Determination of Need Routine (7 days)  Options For Referral Medication Management    "

## 2024-12-25 NOTE — BH Assessment (Signed)
 Comprehensive Clinical Assessment (CCA) Note  12/25/2024 Marie Rice [id number]  Disposition: Per Staci Kerns, NP patient is recommended for inpatient treatment.   The patient demonstrates the following risk factors for suicide: Chronic risk factors for suicide include: psychiatric disorder of bipolar 1. Acute risk factors for suicide include: N/A. Protective factors for this patient include: positive social support and responsibility to others (children, family). Considering these factors, the overall suicide risk at this point appears to be low. Patient is appropriate for outpatient follow up.  Per triage note: Marie Rice is a 13Y female presenting to [facility] as a vol walk-in with her husband and children. Pt states she is here today because she needs medication for her bipolar diagnosis. Pt states she has been without her medicine for over a month and has been having mood swings. Pt denies SI, HI, AVH, and substance use.  Patient is a 39 year old female who presents to [facility] voluntarily accompanied by her husband and children.  Patient denies having any SI HI or AVH.  She also denies engaging in any self-harm behaviors.  The patient endorsed engaging in psychiatric services as well as having an outpatient therapist however when asked the name or agency patient refused to engage further and just stared.   Per the provider's note:'.  Patient asked if her husband could answer questions for her.  Per patient's husband's,  he reports  Marie Rice was recently seen and evaluated at a local emergency department [facility]  and stated that they were told that she needed to follow-up with psychiatry services.  Reports  that patient's has an appointment with [facility] on  [address] on 12/26/2024. Patient's husband states patient has been  talking to herself, experiencing mood lability and flighty.  Patient's Husband stated that patient hasn't taken medication in 9 months due to  pregnancy. Husband denied that she is currently breastfeeding. He reported that ' we can't wait until tomorrow.  During assessment patient was casually dressed sitting in the chair.  She appeared to be alert and oriented to person place time and situation.  The patient's speech was clear and coherent with normal tone and volume.  Her mood was anxious and irritable labile with labile affect.  The patient's thought processes were linear, while thought content contained some paranoid ideation.  Patient's eye contact initially was good but later when she refused to talk she just stared at clinician.  Objectively there is no indication that the patient is currently responding to internal stimuli or experiencing delusional thought content.  Chief Complaint:  Chief Complaint  Patient presents with   Medication Problem   Visit Diagnosis:  Bipolar 1 disorder, depressed (HCC)   CCA Screening, Triage and Referral (STR)  Patient Reported Information How did you hear about us ? Self  What Is the Reason for Your Visit/Call Today? Marie Rice is a 52Y female presenting to [facility] as a vol walk-in with her husband and children. Pt states she is here today because she needs medication for her bipolar diagnosis. Pt states she has been without her medicine for over a month and has been having mood swings. Pt denies SI, HI, AVH, and substance use.  How Long Has This Been Causing You Problems? 1-6 months  What Do You Feel Would Help You the Most Today? Medication(s)   Have You Recently Had Any Thoughts About Hurting Yourself? No  Are You Planning to Commit Suicide/Harm Yourself At This time? No   Flowsheet Row ED from 12/25/2024 in [facility] from 09/07/2023 in [facility]  C-SSRS RISK CATEGORY No Risk Error: Q3, 4, or 5 should not be  populated when Q2 is No High Risk    Have you Recently Had Thoughts About Hurting Someone Sherral? No  Are You Planning to Harm Someone at This Time? No  Explanation: No hx of HI   Have You Used Any Alcohol or Drugs in the Past 24 Hours? No  How Long Ago Did You Use Drugs or Alcohol? N/A What Did You Use and How Much? N/A  Do You Currently Have a Therapist/Psychiatrist? Yes  Name of Therapist/Psychiatrist: Name of Therapist/Psychiatrist: Pt responded yes to having a therapis bur refused to elaborae on the details   Have You Been Recently Discharged From Any Office Practice or Programs? No  Explanation of Discharge From Practice/Program: N/A    CCA Screening Triage Referral Assessment Type of Contact: Face-to-Face  Telemedicine Service Delivery:   Is this Initial or Reassessment?   Date Telepsych consult ordered in [facility]:    Time Telepsych consult ordered in [facility]:    Location of Assessment: [facility] Assessment Services  Provider Location: [facility] Assessment Services  Collateral Involvement: None   Does Patient Have a Automotive Engineer Guardian? No  Legal Guardian Contact Information: N/A  Copy of Legal Guardianship Form: -- (Pt is her own legal guardian)  Legal Guardian Notified of Arrival: -- (N/A)  Legal Guardian Notified of Pending Discharge: -- (N/A)  If Minor and Not Living with Parent(s), Who has Custody? N/A  Is CPS involved or ever been involved? Never  Is APS involved or ever been involved? Never   Patient Determined To Be At Risk for Harm To Self or Others Based on Review of Patient Reported Information or Presenting Complaint? No  Method: No Plan  Availability of Means: No access or NA  Intent: Vague intent or NA  Notification Required: No need or identified person  Additional Information for Danger to Others Potential:NA  Are There Guns or Other Weapons in Your Home? No  Types of Guns/Weapons: Pt denies access to guns rudolfo  Are These  Geophysical Data Processor Secured?                            -- (N/A)  Who Could Verify You Are Able To Have These Secured: N/A  Do You Have any Outstanding Charges, Pending Court Dates, Parole/Probation? Pt denies  Contacted To Inform of Risk of Harm To Self or Others: -- (None)    Does Patient Present under Involuntary Commitment? No    [place] of Residence: [place]   Patient Currently Receiving the Following Services: Medication Management   Determination of Need: Urgent (48 hours)   Options For Referral: Inpatient Hospitalization; Medication Management     CCA Biopsychosocial Patient Reported Schizophrenia/Schizoaffective Diagnosis in Past: No   Strengths: p has ood support sysem and she is willing to sek help   Mental Health Symptoms Depression:  Difficulty Concentrating; Fatigue; Irritability   Duration of Depressive symptoms: Duration of Depressive Symptoms: Greater than two weeks   Mania:  None   Anxiety:   Difficulty concentrating; Irritability   Psychosis:  None   Duration of Psychotic symptoms: Duration of Psychotic Symptoms: N/A   Trauma:  None   Obsessions:  None   Compulsions:  None   Inattention:  Disorganized; Forgetful   Hyperactivity/Impulsivity:  Feeling of restlessness; Fidgets with hands/feet   Oppositional/Defiant Behaviors:  N/A   Emotional Irregularity:  None   Other Mood/Personality Symptoms:  paranoia    Mental Status Exam Appearance and self-care  Stature:  Average   Weight:  Average weight   Clothing:  Casual   Grooming:  Normal   Cosmetic use:  None   Posture/gait:  Normal   Motor activity:  Not Remarkable   Sensorium  Attention:  Normal   Concentration:  Normal   Orientation:  Person; Place; Situation   Recall/memory:  Normal   Affect and Mood  Affect:  Labile   Mood:  Anxious; Irritable   Relating  Eye contact:  Staring   Facial expression:  Constricted   Attitude toward examiner:  Guarded    Thought and Language  Speech flow: Clear and Coherent   Thought content:  Suspicious   Preoccupation:  None   Hallucinations:  None   Organization:  Coherent   [employer] of Knowledge:  Fair   Intelligence:  Average   Abstraction:  Functional   Judgement:  Fair   Dance Movement Psychotherapist:  Adequate   Insight:  Fair   Decision Making:  Impulsive   Social Functioning  Social Maturity:  Irresponsible   Social Judgement:  Heedless   Stress  Stressors:  Other (Comment) (none identified)   Coping Ability:  Overwhelmed   Skill Deficits:  Self-control; Decision making   Supports:  Family     Religion: Religion/Spirituality Are You A Religious Person?: Yes What is Your Religious Affiliation?: Anglican How Might This Affect Treatment?: N/A  Leisure/Recreation: Leisure / Recreation Do You Have Hobbies?: Yes Leisure and Hobbies: Dietitian TV, reading  Exercise/Diet: Exercise/Diet Do You Exercise?: Yes What Type of Exercise Do You Do?:  (Patient refuses to identify what she does for exercise) How Many Times a Week Do You Exercise?: 1-3 times a week Have You Gained or Lost A Significant Amount of Weight in the Past Six Months?: No Do You Follow a Special Diet?: No Do You Have Any Trouble Sleeping?: No   CCA Employment/Education Employment/Work Situation: Employment / Work Situation Employment Situation:  (Unable to assess) Patient's Job has Been Impacted by Current Illness:  (Unable to assess) Has Patient ever Been in the [employer]?: No  Education: Education Is Patient Currently Attending School?: No Last Grade Completed: 12 Did You Attend College?: Yes What Type of College Degree Do you Have?: Pt reports, she graduated from the [employer] for [employer]. Did You Have An Individualized Education Program (IIEP): No Did You Have Any Difficulty At School?: No Patient's Education Has Been Impacted by Current Illness:  No   CCA Family/Childhood History Family and Relationship History: Family history Marital status: Married Number of Years Married:  (Patient refused to answer) What types of issues is patient dealing with in the relationship?: Patient refused to answer Additional relationship information: n/a Does patient have children?: Yes How many children?: 2 How is patient's relationship with their children?: Patient has newborn as well as older child  Childhood History:  Childhood History By whom was/is the patient raised?: Both parents Did patient suffer any verbal/emotional/physical/sexual abuse as a child?: No Did patient suffer from severe childhood neglect?: No Has patient ever been sexually abused/assaulted/raped as an adolescent or adult?: No Was the patient ever a victim of a crime or a disaster?: No Witnessed domestic violence?: No Has patient been affected by domestic  violence as an adult?: No       CCA Substance Use Alcohol/Drug Use: Alcohol / Drug Use Pain Medications: See MAR Prescriptions: See MAR Over the Counter: See MAR History of alcohol / drug use?: No history of alcohol / drug abuse Longest period of sobriety (when/how long): N/A Negative Consequences of Use:  (N/A)                         ASAM's:  Six Dimensions of Multidimensional Assessment  Dimension 1:  Acute Intoxication and/or Withdrawal Potential:   Dimension 1:  Description of individual's past and current experiences of substance use and withdrawal: N/A  Dimension 2:  Biomedical Conditions and Complications:   Dimension 2:  Description of patient's biomedical conditions and  complications: N/A  Dimension 3:  Emotional, Behavioral, or Cognitive Conditions and Complications:  Dimension 3:  Description of emotional, behavioral, or cognitive conditions and complications: N/N  Dimension 4:  Readiness to Change:  Dimension 4:  Description of Readiness to Change criteria: N/A  Dimension 5:   Relapse, Continued use, or Continued Problem Potential:  Dimension 5:  Relapse, continued use, or continued problem potential critiera description: N/A  Dimension 6:  Recovery/Living Environment:  Dimension 6:  Recovery/Iiving environment criteria description: N/A  ASAM Severity Score:    ASAM Recommended Level of Treatment: ASAM Recommended Level of Treatment:  (N/A)   Substance use Disorder (SUD) Substance Use Disorder (SUD)  Checklist Symptoms of Substance Use:  (N/A)  Recommendations for Services/Supports/Treatments: Recommendations for Services/Supports/Treatments Recommendations For Services/Supports/Treatments: Inpatient Hospitalization  Disposition Recommendation per psychiatric provider: We recommend inpatient psychiatric hospitalization when medically cleared. Patient is under voluntary admission status at this time; please IVC if attempts to leave hospital.   DSM5 Diagnoses: Patient Active Problem List   Diagnosis Date Noted   Bipolar 1 disorder, depressed (HCC) 09/14/2023   Elevated prolactin level 09/14/2023     Referrals to Alternative Service(s): Referred to Alternative Service(s):   Place:   Date:   Time:    Referred to Alternative Service(s):   Place:   Date:   Time:    Referred to Alternative Service(s):   Place:   Date:   Time:    Referred to Alternative Service(s):   Place:   Date:   Time:     Lianne JINNY Shuck, LCSW

## 2024-12-25 NOTE — ED Notes (Signed)
" °  Patient is resting in bed at this time with eyes closed,no distress noted,  patient has been calm and cooperative during shift thus far. Patient alert and oriented, with flat affect, slow to answer questions. Patients husband did bring her breast pump and she did pump. Patient has had no complaints. Will continue to monitor for safety   "

## 2024-12-25 NOTE — ED Notes (Signed)
 Patient asking for discharge stating she does not want to be in the hospital.  Patient says she does not want to go to [facility].  She also needs a breast pump as she is breast feeding her 45 month old.  Patient encouraged to call her husband to bring apump to the hospital.Provider made aware of patients desire to leave.

## 2024-12-26 MED ORDER — RISPERIDONE 1 MG PO TABS: 1.0000 mg | ORAL_TABLET | Freq: Two times a day (BID) | ORAL | Status: AC

## 2024-12-26 NOTE — Discharge Instructions (Signed)
 Dear Marie Rice,  You are being transferred to [facility] for psychiatric stabilization. Please continue to take your psychiatric medications prescribed as per your doctor.  Take care, -[facility]

## 2024-12-26 NOTE — ED Provider Notes (Addendum)
 FBC/OBS ASAP Discharge Summary  Date and Time: 12/26/2024 9:47 AM  Name: Marie Rice  MRN:  [id number]   Discharge Diagnoses:  Final diagnoses:  Bipolar 1 disorder, depressed (HCC)    Subjective:  Patient seen and assessed at bedside. Remains confused at times during assessment, pacing, and responding to internal stimuli. Thought blocking present as well and some echopraxia present. She denies SI/HI/VH. She endorses AH but unable to state what they are saying. She also reports significant fatigue secondary to taking care of 59 month old. She is willing to go to inpatient behavioral health voluntarily.   Collateral Ananias Finder, spouse @ [phone]) Spoke with husband who agrees with inpatient psychiatric hospitalization. He is aware of transfer to [facility]. All questions were addressed at the time of call.   Stay Summary:  Patient was started on risperidone  1 mg bid by admitting provider. Notably, she does have history of hyperprolactinemia but unclear if etiology was due to risperidone . She has been accepted to [facility] and will be transferred there today.  Total Time spent with patient: 45 minutes  Past Psychiatric History: Type 1 Bipolar Disorder, 4 prior psychiatric hospitalizations Past Medication History: cariprazine, invega LAI, risperidone , amantadine  Past Medical History: postpartum ~2 months Family History: denies Family Psychiatric History: denies Social History: lives with spouse and 2 children Tobacco Cessation:  N/A, patient does not currently use tobacco products  Current Medications:  Current Facility-Administered Medications  Medication Dose Route Frequency Provider Last Rate Last Admin   acetaminophen  (TYLENOL ) tablet 650 mg  650 mg Oral Q6H PRN Lewis, Tanika N, NP       alum & mag hydroxide-simeth (MAALOX/MYLANTA) 200-200-20 MG/5ML suspension 30 mL  30 mL Oral Q4H PRN Lewis, Tanika N, NP       haloperidol  (HALDOL ) tablet 5 mg  5 mg  Oral TID PRN Ezzard Staci SAILOR, NP       And   diphenhydrAMINE  (BENADRYL ) capsule 50 mg  50 mg Oral TID PRN Lewis, Tanika N, NP       haloperidol  lactate (HALDOL ) injection 5 mg  5 mg Intramuscular TID PRN Ezzard Staci SAILOR, NP       And   diphenhydrAMINE  (BENADRYL ) injection 50 mg  50 mg Intramuscular TID PRN Ezzard Staci SAILOR, NP       And   LORazepam  (ATIVAN ) injection 2 mg  2 mg Intramuscular TID PRN Ezzard Staci SAILOR, NP       haloperidol  lactate (HALDOL ) injection 10 mg  10 mg Intramuscular TID PRN Ezzard Staci SAILOR, NP       And   diphenhydrAMINE  (BENADRYL ) injection 50 mg  50 mg Intramuscular TID PRN Ezzard Staci SAILOR, NP       And   LORazepam  (ATIVAN ) injection 2 mg  2 mg Intramuscular TID PRN Ezzard Staci SAILOR, NP       hydrOXYzine  (ATARAX ) tablet 25 mg  25 mg Oral TID PRN Ezzard Staci SAILOR, NP       magnesium  hydroxide (MILK OF MAGNESIA) suspension 30 mL  30 mL Oral Daily PRN Ezzard Staci SAILOR, NP       risperiDONE  (RISPERDAL ) tablet 1 mg  1 mg Oral BID Lewis, Tanika N, NP   1 mg at 12/26/24 [id number]   Current Outpatient Medications  Medication Sig Dispense Refill   risperiDONE  (RISPERDAL ) 1 MG tablet Take 1 tablet (1 mg total) by mouth 2 (two) times daily.      PTA Medications:  Facility Ordered Medications  Medication  acetaminophen  (TYLENOL ) tablet 650 mg   alum & mag hydroxide-simeth (MAALOX/MYLANTA) 200-200-20 MG/5ML suspension 30 mL   magnesium  hydroxide (MILK OF MAGNESIA) suspension 30 mL   haloperidol  (HALDOL ) tablet 5 mg   And   diphenhydrAMINE  (BENADRYL ) capsule 50 mg   haloperidol  lactate (HALDOL ) injection 5 mg   And   diphenhydrAMINE  (BENADRYL ) injection 50 mg   And   LORazepam  (ATIVAN ) injection 2 mg   haloperidol  lactate (HALDOL ) injection 10 mg   And   diphenhydrAMINE  (BENADRYL ) injection 50 mg   And   LORazepam  (ATIVAN ) injection 2 mg   hydrOXYzine  (ATARAX ) tablet 25 mg   risperiDONE  (RISPERDAL ) tablet 1 mg   PTA Medications  Medication Sig   risperiDONE   (RISPERDAL ) 1 MG tablet Take 1 tablet (1 mg total) by mouth 2 (two) times daily.       07/18/2023    4:21 PM 11/05/2021   10:33 AM  Depression screen PHQ 2/9  Decreased Interest 2 0  Down, Depressed, Hopeless 3 0  PHQ - 2 Score 5 0  Altered sleeping 2 0  Tired, decreased energy 2 0  Change in appetite 2 0  Feeling bad or failure about yourself  2 0  Trouble concentrating 1 0  Moving slowly or fidgety/restless 1 1  Suicidal thoughts 0 0  PHQ-9 Score 15  1   Difficult doing work/chores Very difficult Not difficult at all     Data saved with a previous flowsheet row definition    Flowsheet Row ED from 12[facility]25/2024 in [facility]Discharged[facility] from [facility] from 09/07/2023 in [facility] Emergency Department at [facility]  C-SSRS RISK CATEGORY No Risk Error: Q3, 4, or 5 should not be populated when Q2 is No High Risk    Musculoskeletal  Strength & Muscle Tone: within normal limits Gait & Station: normal Patient leans: N/A  Psychiatric Specialty Exam  Presentation  General Appearance:  Disheveled, appeared stated age AA female in hospital scrubs.  Eye Contact: Minimal  Speech: Clear and Coherent  Speech Volume: Normal   Mood and Affect  Mood: Fine Affect: Flat  Thought Process  Thought Processes: Disorganized, thought blocking   Descriptions of Associations:Intact  Orientation:Full (Time, Place and Person)  Thought Content:Paranoid Ideation  Diagnosis of Schizophrenia or Schizoaffective disorder in past: No    Hallucinations: AH unable to specify what  Ideas of Reference:None  Suicidal Thoughts:Suicidal Thoughts: No  Homicidal Thoughts:Homicidal Thoughts: No   Sensorium  Memory: Immediate Fair; Remote Good  Judgment: Good  Insight: Fair   Art Therapist  Concentration: Poor  Attention Span: poor Recall: Fair  Fund of  Knowledge: Good  Language: Good   Psychomotor Activity  Psychomotor Activity: Psychomotor Activity: Normal   Assets  Assets: Communication Skills; Desire for Improvement   Sleep  Sleep: Sleep: Fair  Estimated Sleeping Duration (Last 24 Hours): 9.25-10.25 hours  Nutritional Assessment (For OBS and FBC admissions only) Has the patient had a weight loss or gain of 10 pounds or more in the last 3 months?: No Has the patient had a decrease in food intake/or appetite?: No Does the patient have dental problems?: No Does the patient have eating habits or behaviors that may be indicators of an eating disorder including binging or inducing vomiting?: No Has the patient recently lost weight without trying?: 0 Has the patient been eating poorly because of a decreased appetite?: 0 Malnutrition Screening Tool Score: 0  Physical Exam  Physical Exam ROS Blood pressure 130/85, pulse (!) 105, temperature 98 F (36.7 C), temperature source Oral, resp. rate 18, SpO2 98%. There is no height or weight on file to calculate BMI.  Demographic Factors:  NA  Loss Factors: NA  Historical Factors: Impulsivity  Risk Reduction Factors:   Responsible for children under 19 years of age, Sense of responsibility to family, Living with another person, especially a relative, Positive social support, Positive therapeutic relationship, and Positive coping skills or problem solving skills  Continued Clinical Symptoms:  More than one psychiatric diagnosis Currently Psychotic Previous Psychiatric Diagnoses and Treatments  Cognitive Features That Contribute To Risk:  None    Suicide Risk:  Minimal: No identifiable suicidal ideation.  Patients presenting with no risk factors but with morbid ruminations; may be classified as minimal risk based on the severity of the depressive symptoms  Plan Of Care/Follow-up recommendations:  Transfer to [facility]  Prentice Espy,  MD 12/26/2024, 9:47 AM

## 2024-12-26 NOTE — ED Notes (Signed)
 Pt denies pain at this time.  She is currently in the bathroom looking at herself in the mirror.  This clinical research associate asked if she would answer some questions.  Pt reported no.  Will attempt to ask questions once pt leaves the bathroom.

## 2024-12-26 NOTE — ED Notes (Signed)
 Patient is transferring to [facility] at this time via safe transport with [facility] by side. Voluntary consent, EMTALA, and other transfer paperwork sent with patient and provided to transport. Patient is cooperative and redirectable at time of transfer. All valuables/belongings sent with patient.

## 2024-12-26 NOTE — ED Notes (Signed)
 Patient is resting in bed with eyes closed, noted to be restless at times,  respirations even and unlabored, no distress noted, will continue to monitor

## 2024-12-26 NOTE — ED Notes (Signed)
 Report called to [facility].  Ride arranged via safe transport.

## 2024-12-26 NOTE — ED Notes (Signed)
 Pt husband called in to inform staff to dump the breast milk pumped by patient because pt has been medicated and not to inform pt about it.

## 2024-12-26 NOTE — Progress Notes (Signed)
 Pt has been accepted to [facility] on 12/26/2024 Bed assignment: EM[facility] C   Pt meets inpatient criteria per: Staci Kerns NP  Attending Physician will be: Dr. Ilsa   Report can be called to: [phone]  Pt can arrive after ASAP    Care Team Notified: Carlyon Kemp LPN   [place] Tava Peery LCSW  12/26/2024 9:37 AM

## 2024-12-26 NOTE — ED Notes (Signed)
 Patient walking around stating that she needs to exercise but stops and stares at the floor for a long period of time, states that she does not need anything

## 2024-12-26 NOTE — ED Notes (Signed)
 Pt denies SI and HI.  Reports she slept well last night and says her mood is fine.  When asked about AVH pt reported a little bit.  I asked if she could describe what she was seeing and hearing patient did not answer.  Pt was noted talking to herself in low tones. Will continue to monitor for safety.

## 2024-12-26 NOTE — Progress Notes (Signed)
 LCSW Progress Note  [id number]   Marie Rice  12/26/2024  8:56 AM  Description:   Inpatient Psychiatric Referral  Patient was recommended inpatient per Staci Kerns (NP). There are no available beds at [facility], per [facility] Carlo RN). Patient was referred to the following out of network facilities:   Destination  Service Provider Address Phone Fax  [facility]  [address] [place], [place] [address] [phone]  [facility]  [address] [place] [address] [phone]  [facility]  [address], [place] [address] [phone]  [facility]  [address] [place] [address] [phone]  [facility] EFAX  [address], [place] [phone]  [facility]  [address] [place] [address] [phone]  [facility]  [address] [place] [address] [phone]  [facility]  [address] [place] [address] [phone]      Situation ongoing, CSW to continue following and update chart as more information becomes available.      [name], MSW, LCSW  12/26/2024 8:56 AM
